# Patient Record
Sex: Male | Born: 1960
Health system: Southern US, Community
[De-identification: ages and names within clinical notes are randomized; demographics above are authoritative.]

## PROBLEM LIST (undated history)

## (undated) DIAGNOSIS — Z860101 Personal history of adenomatous and serrated colon polyps: Secondary | ICD-10-CM

## (undated) DIAGNOSIS — E1165 Type 2 diabetes mellitus with hyperglycemia: Secondary | ICD-10-CM

## (undated) DIAGNOSIS — Z794 Long term (current) use of insulin: Secondary | ICD-10-CM

## (undated) DIAGNOSIS — R8581 Anal high risk human papillomavirus (HPV) DNA test positive: Secondary | ICD-10-CM

## (undated) DIAGNOSIS — B20 Human immunodeficiency virus [HIV] disease: Secondary | ICD-10-CM

## (undated) DIAGNOSIS — E785 Hyperlipidemia, unspecified: Secondary | ICD-10-CM

## (undated) DIAGNOSIS — E119 Type 2 diabetes mellitus without complications: Secondary | ICD-10-CM

## (undated) DIAGNOSIS — Z973 Presence of spectacles and contact lenses: Secondary | ICD-10-CM

## (undated) DIAGNOSIS — Z86010 Personal history of colonic polyps: Secondary | ICD-10-CM

## (undated) HISTORY — PX: CHOLECYSTECTOMY, LAPAROSCOPIC: SHX56

---

## 2005-03-03 ENCOUNTER — Emergency Department: Payer: Self-pay | Admitting: Emergency Medicine

## 2009-10-04 ENCOUNTER — Ambulatory Visit: Payer: Self-pay | Admitting: Gastroenterology

## 2015-03-05 DIAGNOSIS — E785 Hyperlipidemia, unspecified: Secondary | ICD-10-CM | POA: Insufficient documentation

## 2015-03-05 DIAGNOSIS — E119 Type 2 diabetes mellitus without complications: Secondary | ICD-10-CM | POA: Insufficient documentation

## 2017-05-24 ENCOUNTER — Encounter: Payer: Self-pay | Admitting: *Deleted

## 2017-05-25 ENCOUNTER — Encounter: Payer: Self-pay | Admitting: Anesthesiology

## 2017-05-25 ENCOUNTER — Ambulatory Visit: Payer: BLUE CROSS/BLUE SHIELD | Admitting: Anesthesiology

## 2017-05-25 ENCOUNTER — Ambulatory Visit
Admission: RE | Admit: 2017-05-25 | Discharge: 2017-05-25 | Disposition: A | Discharge status: Home / Self Care | Payer: BLUE CROSS/BLUE SHIELD | Source: Ambulatory Visit | Attending: Gastroenterology | Admitting: Gastroenterology

## 2017-05-25 ENCOUNTER — Encounter
Admission: RE | Disposition: A | Discharge status: Home / Self Care | Payer: Self-pay | Source: Ambulatory Visit | Attending: Gastroenterology

## 2017-05-25 DIAGNOSIS — E785 Hyperlipidemia, unspecified: Secondary | ICD-10-CM | POA: Diagnosis not present

## 2017-05-25 DIAGNOSIS — Z79899 Other long term (current) drug therapy: Secondary | ICD-10-CM | POA: Diagnosis not present

## 2017-05-25 DIAGNOSIS — Z8371 Family history of colonic polyps: Secondary | ICD-10-CM | POA: Insufficient documentation

## 2017-05-25 DIAGNOSIS — D124 Benign neoplasm of descending colon: Secondary | ICD-10-CM | POA: Diagnosis not present

## 2017-05-25 DIAGNOSIS — Z794 Long term (current) use of insulin: Secondary | ICD-10-CM | POA: Insufficient documentation

## 2017-05-25 DIAGNOSIS — E119 Type 2 diabetes mellitus without complications: Secondary | ICD-10-CM | POA: Diagnosis not present

## 2017-05-25 DIAGNOSIS — K219 Gastro-esophageal reflux disease without esophagitis: Secondary | ICD-10-CM | POA: Diagnosis not present

## 2017-05-25 HISTORY — PX: COLONOSCOPY WITH PROPOFOL: SHX5780

## 2017-05-25 HISTORY — DX: Hyperlipidemia, unspecified: E78.5

## 2017-05-25 HISTORY — DX: Type 2 diabetes mellitus without complications: E11.9

## 2017-05-25 LAB — GLUCOSE, CAPILLARY: GLUCOSE-CAPILLARY: 119 mg/dL — AB (ref 65–99)

## 2017-05-25 SURGERY — COLONOSCOPY WITH PROPOFOL
Anesthesia: General

## 2017-05-25 MED ORDER — LIDOCAINE HCL (CARDIAC) 20 MG/ML IV SOLN
INTRAVENOUS | Status: DC | PRN
  Administered 2017-05-25: 40 mg via INTRAVENOUS

## 2017-05-25 MED ORDER — EPHEDRINE SULFATE 50 MG/ML IJ SOLN
INTRAMUSCULAR | Status: AC
  Filled 2017-05-25: qty 1

## 2017-05-25 MED ORDER — PHENYLEPHRINE HCL 10 MG/ML IJ SOLN
INTRAMUSCULAR | Status: AC
  Filled 2017-05-25: qty 1

## 2017-05-25 MED ORDER — SODIUM CHLORIDE 0.9 % IV SOLN: INTRAVENOUS | Status: DC

## 2017-05-25 MED ORDER — GLYCOPYRROLATE 0.2 MG/ML IJ SOLN
INTRAMUSCULAR | Status: AC
  Filled 2017-05-25: qty 1

## 2017-05-25 MED ORDER — SODIUM CHLORIDE 0.9 % IV SOLN
INTRAVENOUS | Status: DC
Start: 2017-05-25 — End: 2017-05-25

## 2017-05-25 MED ORDER — SODIUM CHLORIDE 0.9 % IV SOLN
INTRAVENOUS | Status: DC
  Administered 2017-05-25: 1000 mL via INTRAVENOUS

## 2017-05-25 MED ORDER — MIDAZOLAM HCL 2 MG/2ML IJ SOLN
INTRAMUSCULAR | Status: DC | PRN
  Administered 2017-05-25: 1 mg via INTRAVENOUS

## 2017-05-25 MED ORDER — PROPOFOL 10 MG/ML IV BOLUS
INTRAVENOUS | Status: DC | PRN
  Administered 2017-05-25: 70 mg via INTRAVENOUS

## 2017-05-25 MED ORDER — MIDAZOLAM HCL 2 MG/2ML IJ SOLN
INTRAMUSCULAR | Status: AC
  Filled 2017-05-25: qty 2

## 2017-05-25 MED ORDER — PROPOFOL 500 MG/50ML IV EMUL
INTRAVENOUS | Status: DC | PRN
  Administered 2017-05-25: 140 ug/kg/min via INTRAVENOUS

## 2017-05-25 MED ORDER — PROPOFOL 500 MG/50ML IV EMUL
INTRAVENOUS | Status: AC
  Filled 2017-05-25: qty 150

## 2017-05-25 MED ORDER — LIDOCAINE HCL (PF) 2 % IJ SOLN
INTRAMUSCULAR | Status: AC
  Filled 2017-05-25: qty 2

## 2017-05-25 NOTE — H&P (Signed)
Outpatient short stay form Pre-procedure 05/25/2017 7:51 AM Lollie Sails MD  Primary Physician: Dr. Juluis Pitch  Reason for visit:  Colonoscopy  History of present illness:  Patient is a 56 year old male presenting today as above. He tolerated his prep well. He takes no aspirin or blood thinning agents. He does have a family history: Polyps her primary relative, father.    Current Facility-Administered Medications:  .  0.9 %  sodium chloride infusion, , Intravenous, Continuous, Lollie Sails, MD, Last Rate: 20 mL/hr at 05/25/17 0718, 1,000 mL at 05/25/17 0718 .  0.9 %  sodium chloride infusion, , Intravenous, Continuous, Lollie Sails, MD .  0.9 %  sodium chloride infusion, , Intravenous, Continuous, Lollie Sails, MD  Prescriptions Prior to Admission  Medication Sig Dispense Refill Last Dose  . Cetirizine HCl 10 MG CAPS Take 10 mg by mouth daily.     . fenofibrate 160 MG tablet Take 160 mg by mouth daily.     . Insulin Glargine (LANTUS) 100 UNIT/ML Solostar Pen Inject 50-100 Units into the skin. Daily as directed     . metFORMIN (GLUCOPHAGE) 1000 MG tablet Take 1,000 mg by mouth 2 (two) times daily with a meal.     . saxagliptin HCl (ONGLYZA) 5 MG TABS tablet Take by mouth daily.     . simvastatin (ZOCOR) 40 MG tablet Take 40 mg by mouth daily.        Not on File   Past Medical History:  Diagnosis Date  . Diabetes mellitus without complication ([place])   . Hyperlipidemia     Review of systems:      Physical Exam    Heart and lungs: Regular rate and rhythm without rub or gallop, lungs are bilaterally clear    HEENT: Normocephalic atraumatic eyes are anicteric    Other:     Pertinant exam for procedure: Soft nontender nondistended bowel sounds positive normoactive.    Planned proceedures: Colonoscopy and indicated procedures. I have discussed the risks benefits and complications of procedures to include not limited to bleeding, infection,  perforation and the risk of sedation and the patient wishes to proceed.    Lollie Sails, MD Gastroenterology 05/25/2017  7:51 AM

## 2017-05-25 NOTE — Transfer of Care (Signed)
Immediate Anesthesia Transfer of Care Note  Patient: Phillip Edwards  Procedure(s) Performed: Procedure(s): COLONOSCOPY WITH PROPOFOL (N/A)  Patient Location: PACU  Anesthesia Type:General  Level of Consciousness: awake, alert  and oriented  Airway & Oxygen Therapy: Patient Spontanous Breathing and Patient connected to nasal cannula oxygen  Post-op Assessment: Report given to RN and Post -op Vital signs reviewed and stable  Post vital signs: Reviewed and stable  Last Vitals:  Vitals:   05/25/17 0820 05/25/17 0822  BP: 91/69 91/69  Pulse: 87 82  Resp: 14 13  Temp: (!) 36.1 C (!) 36.1 C    Last Pain:  Vitals:   05/25/17 0822  TempSrc: Tympanic         Complications: No apparent anesthesia complications

## 2017-05-25 NOTE — Op Note (Signed)
[facility] Gastroenterology Patient Name: Phillip Edwards Procedure Date: 05/25/2017 7:50 AM MRN: [id number] Account #: [id number] Date of Birth: 1961-08-10 Admit Type: Outpatient Age: 56 Room: [facility] ENDO ROOM 1 Gender: Male Note Status: Finalized Procedure:            Colonoscopy Providers:            Lollie Sails, MD Referring MD:         Youlanda Roys. Lovie Macadamia, MD (Referring MD) Medicines:            Monitored Anesthesia Care Complications:        No immediate complications. Procedure:            Pre-Anesthesia Assessment:                       - ASA Grade Assessment: III - A patient with severe                        systemic disease.                       After obtaining informed consent, the colonoscope was                        passed under direct vision. Throughout the procedure,                        the patient's blood pressure, pulse, and oxygen                        saturations were monitored continuously. The                        Colonoscope was introduced through the anus and                        advanced to the the cecum, identified by appendiceal                        orifice and ileocecal valve. The colonoscopy was                        performed without difficulty. The patient tolerated the                        procedure well. The quality of the bowel preparation                        was good. Findings:      A 5 mm polyp was found in the proximal descending colon. The polyp was       sessile. The polyp was removed with a cold snare. Resection and       retrieval were complete.      The digital rectal exam was normal.      The exam was otherwise without abnormality. Impression:           - One 5 mm polyp in the proximal descending colon,                        removed with a cold snare. Resected and retrieved. Recommendation:       -  Discharge patient to home. Procedure Code(s):    --- Professional ---  [phone], Colonoscopy, flexible; with removal of tumor(s),                        polyp(s), or other lesion(s) by snare technique Diagnosis Code(s):    --- Professional ---                       D12.4, Benign neoplasm of descending colon CPT copyright 2016 American Medical Association. All rights reserved. The codes documented in this report are preliminary and upon coder review may  be revised to meet current compliance requirements. Lollie Sails, MD 05/25/2017 8:20:29 AM This report has been signed electronically. Number of Addenda: 0 Note Initiated On: 05/25/2017 7:50 AM Scope Withdrawal Time: 0 hours 12 minutes 27 seconds  Total Procedure Duration: 0 hours 20 minutes 42 seconds       [facility]

## 2017-05-25 NOTE — Anesthesia Preprocedure Evaluation (Signed)
Anesthesia Evaluation  Patient identified by MRN, date of birth, ID band Patient awake    Reviewed: Allergy & Precautions, H&P , NPO status , Patient's Chart, lab work & pertinent test results  History of Anesthesia Complications Negative for: history of anesthetic complications  Airway Mallampati: III  TM Distance: <3 FB Neck ROM: full    Dental  (+) Chipped, Caps   Pulmonary neg pulmonary ROS, neg shortness of breath,           Cardiovascular Exercise Tolerance: Good (-) angina(-) Past MI and (-) DOE negative cardio ROS       Neuro/Psych negative neurological ROS  negative psych ROS   GI/Hepatic negative GI ROS, Neg liver ROS, neg GERD  ,  Endo/Other  diabetes, Type 2, Insulin Dependent  Renal/GU negative Renal ROS  negative genitourinary   Musculoskeletal   Abdominal   Peds  Hematology negative hematology ROS (+)   Anesthesia Other Findings Past Medical History: No date: Diabetes mellitus without complication (HCC) No date: Hyperlipidemia  History reviewed. No pertinent surgical history.     Reproductive/Obstetrics negative OB ROS                             Anesthesia Physical Anesthesia Plan  ASA: III  Anesthesia Plan: General   Post-op Pain Management:    Induction: Intravenous  PONV Risk Score and Plan:   Airway Management Planned: Natural Airway and Nasal Cannula  Additional Equipment:   Intra-op Plan:   Post-operative Plan:   Informed Consent: I have reviewed the patients History and Physical, chart, labs and discussed the procedure including the risks, benefits and alternatives for the proposed anesthesia with the patient or authorized representative who has indicated his/her understanding and acceptance.   Dental Advisory Given  Plan Discussed with: Anesthesiologist, CRNA and Surgeon  Anesthesia Plan Comments: (Patient consented for risks of anesthesia  including but not limited to:  - adverse reactions to medications - risk of intubation if required - damage to teeth, lips or other oral mucosa - sore throat or hoarseness - Damage to heart, brain, lungs or loss of life  Patient voiced understanding.)        Anesthesia Quick Evaluation

## 2017-05-25 NOTE — Anesthesia Post-op Follow-up Note (Cosign Needed)
Anesthesia QCDR form completed.        

## 2017-05-25 NOTE — Anesthesia Procedure Notes (Signed)
Date/Time: 05/25/2017 7:50 AM Performed by: Hedda Slade Pre-anesthesia Checklist: Patient identified, Emergency Drugs available, Suction available and Patient being monitored Patient Re-evaluated:Patient Re-evaluated prior to induction Oxygen Delivery Method: Nasal cannula

## 2017-05-25 NOTE — Anesthesia Postprocedure Evaluation (Signed)
Anesthesia Post Note  Patient: Phillip Edwards  Procedure(s) Performed: Procedure(s) (LRB): COLONOSCOPY WITH PROPOFOL (N/A)  Patient location during evaluation: Endoscopy Anesthesia Type: General Level of consciousness: awake and alert Pain management: pain level controlled Vital Signs Assessment: post-procedure vital signs reviewed and stable Respiratory status: spontaneous breathing, nonlabored ventilation, respiratory function stable and patient connected to nasal cannula oxygen Cardiovascular status: blood pressure returned to baseline and stable Postop Assessment: no signs of nausea or vomiting Anesthetic complications: no     Last Vitals:  Vitals:   05/25/17 0840 05/25/17 0850  BP: 111/80 108/82  Pulse: 78 70  Resp: 12 12  Temp:      Last Pain:  Vitals:   05/25/17 0822  TempSrc: Tympanic                 Precious Haws Piscitello

## 2017-05-28 ENCOUNTER — Encounter: Payer: Self-pay | Admitting: Gastroenterology

## 2017-05-29 LAB — SURGICAL PATHOLOGY

## 2018-09-08 DIAGNOSIS — Z23 Encounter for immunization: Secondary | ICD-10-CM | POA: Diagnosis not present

## 2018-12-06 DIAGNOSIS — E119 Type 2 diabetes mellitus without complications: Secondary | ICD-10-CM | POA: Diagnosis not present

## 2018-12-06 DIAGNOSIS — E785 Hyperlipidemia, unspecified: Secondary | ICD-10-CM | POA: Diagnosis not present

## 2018-12-06 DIAGNOSIS — Z125 Encounter for screening for malignant neoplasm of prostate: Secondary | ICD-10-CM | POA: Diagnosis not present

## 2018-12-10 DIAGNOSIS — E119 Type 2 diabetes mellitus without complications: Secondary | ICD-10-CM | POA: Diagnosis not present

## 2018-12-10 DIAGNOSIS — E785 Hyperlipidemia, unspecified: Secondary | ICD-10-CM | POA: Diagnosis not present

## 2018-12-10 DIAGNOSIS — Z794 Long term (current) use of insulin: Secondary | ICD-10-CM | POA: Diagnosis not present

## 2018-12-10 DIAGNOSIS — Z125 Encounter for screening for malignant neoplasm of prostate: Secondary | ICD-10-CM | POA: Diagnosis not present

## 2020-11-06 DIAGNOSIS — Z8669 Personal history of other diseases of the nervous system and sense organs: Secondary | ICD-10-CM

## 2020-11-06 DIAGNOSIS — H5462 Unqualified visual loss, left eye, normal vision right eye: Secondary | ICD-10-CM

## 2020-11-06 HISTORY — DX: Personal history of other diseases of the nervous system and sense organs: Z86.69

## 2020-11-06 HISTORY — DX: Unqualified visual loss, left eye, normal vision right eye: H54.62

## 2020-12-28 ENCOUNTER — Ambulatory Visit: Payer: Self-pay | Admitting: Family Medicine

## 2020-12-28 ENCOUNTER — Other Ambulatory Visit: Payer: Self-pay

## 2020-12-28 ENCOUNTER — Ambulatory Visit: Payer: Self-pay

## 2020-12-28 ENCOUNTER — Encounter: Payer: Self-pay | Admitting: Family Medicine

## 2020-12-28 ENCOUNTER — Telehealth: Payer: Self-pay

## 2020-12-28 DIAGNOSIS — Z202 Contact with and (suspected) exposure to infections with a predominantly sexual mode of transmission: Secondary | ICD-10-CM

## 2020-12-28 DIAGNOSIS — Z113 Encounter for screening for infections with a predominantly sexual mode of transmission: Secondary | ICD-10-CM

## 2020-12-28 MED ORDER — PENICILLIN G BENZATHINE 1200000 UNIT/2ML IM SUSP
2.4000 10*6.[IU] | Freq: Once | INTRAMUSCULAR | Status: AC
  Administered 2020-12-28: 2.4 10*6.[IU] via INTRAMUSCULAR

## 2020-12-28 NOTE — Progress Notes (Signed)
TR pending. Bicillin administered per provider orders. Pt declined to stay 20 min post bicillin as requested by RN. Provider orders completed.

## 2020-12-28 NOTE — Progress Notes (Signed)
[facility] Department STI clinic/screening visit  Subjective:  Phillip Edwards is a 60 y.o. male being seen today for an STI screening visit. The patient reports they do not have symptoms.    Patient has the following medical conditions:  There are no problems to display for this patient.    Chief Complaint  Patient presents with  . SEXUALLY TRANSMITTED DISEASE    STD Screening including bloodwork    HPI  Patient reports having partner that tested positive for syphilis, was tested at [facility] and    See flowsheet for further details and programmatic requirements.    The following portions of the patient's history were reviewed and updated as appropriate: allergies, current medications, past medical history, past social history, past surgical history and problem list.  Objective:  There were no vitals filed for this visit.  Physical Exam Constitutional:      Appearance: Normal appearance.  HENT:     Head: Normocephalic.     Mouth/Throat:     Mouth: Mucous membranes are moist.     Pharynx: Oropharynx is clear. No oropharyngeal exudate.  Pulmonary:     Effort: Pulmonary effort is normal.  Genitourinary:    Rectum: Normal.     Comments: No lice, nits, or pest, no lesions or odor discharge.  Denies pain or tenderness with paplation of testicles.  No lesions, ulcers or masses present.    Musculoskeletal:     Cervical back: Normal range of motion.  Lymphadenopathy:     Cervical: No cervical adenopathy.  Skin:    General: Skin is warm and dry.     Findings: No bruising, erythema, lesion or rash.  Neurological:     Mental Status: He is alert and oriented to person, place, and time.  Psychiatric:        Mood and Affect: Mood normal.        Behavior: Behavior normal.       Assessment and Plan:  Phillip Edwards is a 60 y.o. male presenting to the [facility] Department for STI screening  1. Screening examination for venereal disease Patient  in clinic for sti testing today.   - Chlamydia/Gonorrhea [place] [facility]- urine - HBV Antigen/Antibody [facility] - HIV/HCV [place] [facility] - Syphilis Serology, [place] [facility] - Chlamydia/Gonorrhea [place] [facility]- throat  - Chlamydia/Gonorrhea [place] [facility]-rectal   2. Exposure to syphilis Patient has partner that tested positive for Syphilis, patient was last tested on 11/01/2020 and was negative.  Patient concerned about a rash.  Upon examination, no syphilis rash present.  Patient did have multiple abrasions or papules on left anterior lower leg and left knee but not consistent with syphilis.  Will treat patient as contact to syphilis.  Patient will be contacted if test results are abnormal and needs additional treatment.    RX: - penicillin g benzathine (BICILLIN LA) 1200000 UNIT/2ML injection 2.4 Million Units  Patient does not have STI symptoms Patient accepted all screenings including   oral, urine, rectal CT/GC and bloodwork for HIV/RPR/HCV/HBV Patient meets criteria for HepB screening? Yes. Ordered? Yes Patient meets criteria for HepC screening? Yes. Ordered? Yes Recommended condom use with all sex, but not sex for 14 days.   Discussed importance of condom use for STI prevention.    Discussed time line for [facility] results and that patient will be called with positive results and encouraged patient to call if he had not heard in 2 weeks  Discussed with  patient about syphilis and testing and how syphilis remains present even when not infectious.    Recommended returning for continued or worsening symptoms.   Return in about 10 days (around 01/07/2021) for possible need for treatment #2 .  No future appointments.  Junious Dresser, FNP

## 2020-12-28 NOTE — Telephone Encounter (Signed)
Call to patient to RS. Patient rescheduled for today at 4:00pm. Patient is having an "outbreak". Deri Fuelling, RN

## 2021-01-04 LAB — HM HIV SCREENING LAB: HM HIV Screening: NEGATIVE

## 2021-01-04 LAB — HM HEPATITIS C SCREENING LAB: HM Hepatitis Screen: NEGATIVE

## 2021-01-04 LAB — HEPATITIS B SURFACE ANTIGEN

## 2021-01-06 ENCOUNTER — Telehealth: Payer: Self-pay

## 2021-01-06 DIAGNOSIS — A549 Gonococcal infection, unspecified: Secondary | ICD-10-CM

## 2021-01-06 NOTE — Telephone Encounter (Signed)
TC to patient. Verified ID via password/SS#. Informed of positive GC and need for tx. Instructed to eat before visit and have partner call for tx appt. Appt scheduled. Aileen Fass, RN

## 2021-01-07 ENCOUNTER — Other Ambulatory Visit: Payer: Self-pay

## 2021-01-07 ENCOUNTER — Ambulatory Visit: Payer: 59

## 2021-01-07 DIAGNOSIS — A549 Gonococcal infection, unspecified: Secondary | ICD-10-CM

## 2021-01-07 MED ORDER — CEFTRIAXONE SODIUM 500 MG IJ SOLR
500.0000 mg | Freq: Once | INTRAMUSCULAR | Status: AC
  Administered 2021-01-07: 500 mg via INTRAMUSCULAR

## 2021-01-07 NOTE — Progress Notes (Signed)
Pt here for gonorrhea tx and requests Syphilis results from 12/28/2020. Marland Kitchen Consult with Criss Rosales, PA who obtained results from [facility] and  Syphilis TP Non reactive . Results given and explained to pt. No further tx for syphilis indicated today, per [place]. [place], [place]. Treated today for gonorrhea with Ceftriaxone per [facility] Dr. Raliegh Ip. [place].Reports soreness in hips and requests injection in L Deltoid. Tolerated well. Refused to stay for 20 min observation after injection d/t time constraints. Josie Saunders, RN

## 2021-01-07 NOTE — Progress Notes (Signed)
Consulted by RN re: patient situation.  Reviewed RN note and agree that it reflects our discussion and my recommendations.

## 2021-01-17 NOTE — Addendum Note (Signed)
Addended by: Junious Dresser on: 01/17/2021 04:47 PM   Modules accepted: Level of Service

## 2021-03-11 ENCOUNTER — Other Ambulatory Visit: Payer: Self-pay | Admitting: Optometry

## 2021-03-11 DIAGNOSIS — H469 Unspecified optic neuritis: Secondary | ICD-10-CM

## 2021-03-15 ENCOUNTER — Ambulatory Visit: Admission: RE | Admit: 2021-03-15 | Payer: 59 | Source: Ambulatory Visit

## 2021-03-15 ENCOUNTER — Ambulatory Visit: Payer: 59

## 2021-03-16 ENCOUNTER — Ambulatory Visit
Admission: RE | Admit: 2021-03-16 | Discharge: 2021-03-16 | Disposition: A | Discharge status: Home / Self Care | Payer: 59 | Source: Ambulatory Visit | Attending: Optometry | Admitting: Optometry

## 2021-03-16 ENCOUNTER — Other Ambulatory Visit: Payer: Self-pay

## 2021-03-16 DIAGNOSIS — H469 Unspecified optic neuritis: Secondary | ICD-10-CM | POA: Diagnosis not present

## 2021-03-16 MED ORDER — GADOBUTROL 1 MMOL/ML IV SOLN
7.0000 mL | Freq: Once | INTRAVENOUS | Status: AC | PRN
  Administered 2021-03-16: 7 mL via INTRAVENOUS

## 2021-06-07 IMAGING — MR MR HEAD WO/W CM
14 series · 48 of 48 positions shown · IV contrast (7ml Gadavist)
Comparison: No pertinent prior exam.

CLINICAL DATA: Optic neuropathy.

EXAM:
MRI OF THE BRAIN WITHOUT AND WITH CONTRAST
MRI OF THE ORBITS WITHOUT AND WITH CONTRAST
TECHNIQUE: Multiplanar, multi-echo pulse sequences of the brain, orbits and
surrounding structures were acquired including fat saturation
techniques, before and after intravenous contrast administration.
CONTRAST:  7mL GADAVIST GADOBUTROL 1 MMOL/ML IV SOLN

[Series 5: ax dwi_tracew · axial · 3.0mm · 0.65mm/px · z∈[-72,+80]mm · 3 of 48 slices shown]
[im 1/48]
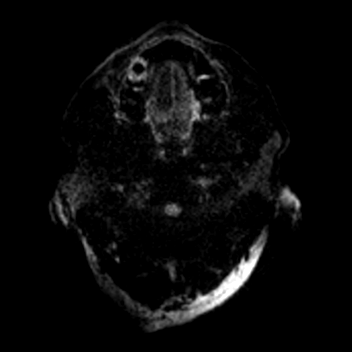
[im 24/48]
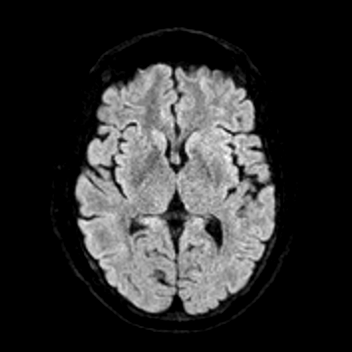
[im 48/48]
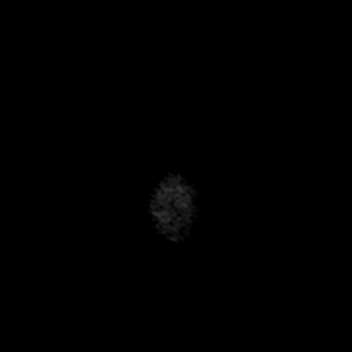

[Series 6: ax dwi_adc · axial · 3.0mm · 0.65mm/px · z∈[-72,+80]mm · 3 of 48 slices shown]
[im 1/48]
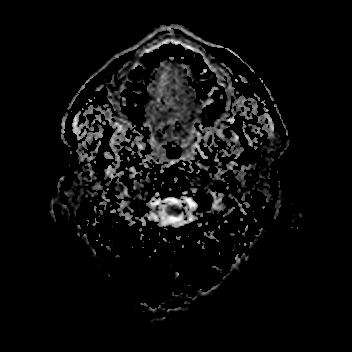
[im 24/48]
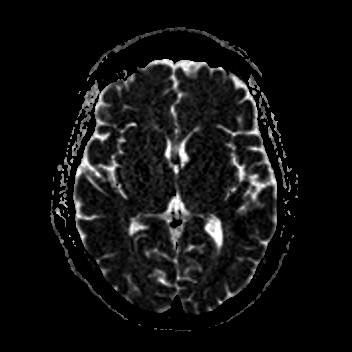
[im 48/48]
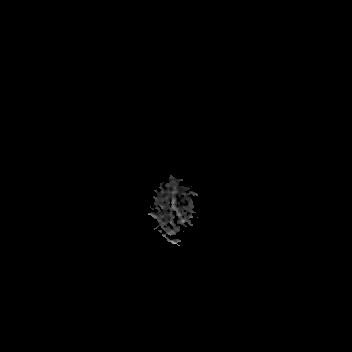

[Series 7: cor dwi_tracew · coronal · 5.0mm · 0.60mm/px · 2 of 38 slices shown]
[im 1/38]
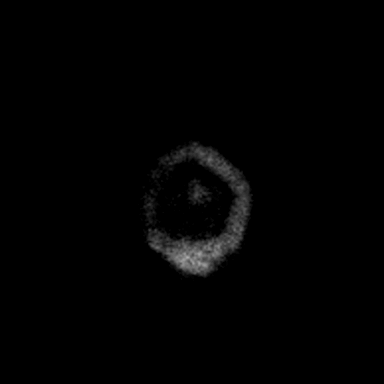
[im 38/38]
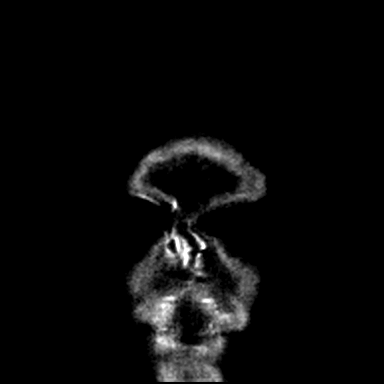

[Series 8: cor dwi_adc · coronal · 5.0mm · 0.60mm/px · 2 of 38 slices shown]
[im 1/38]
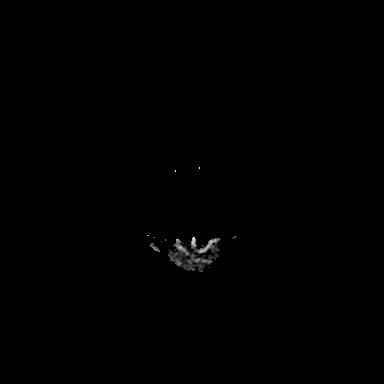
[im 38/38]
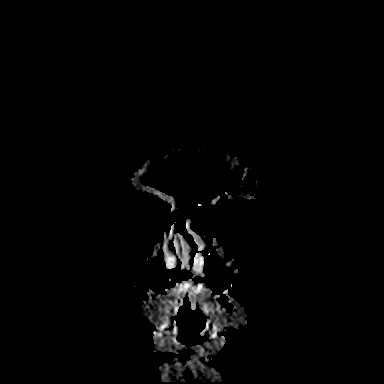

[Series 9: T1 · sagittal · 5.0mm · 0.62mm/px · 1 of 25 slices shown (1 of 2)]
[im 1/25]
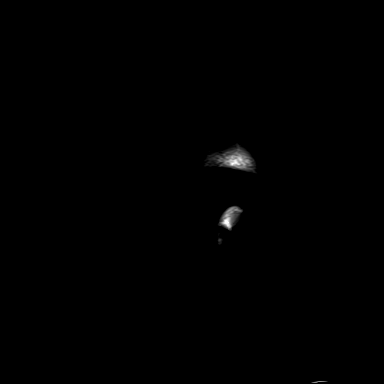

[Series 10: T2 · axial · 5.0mm · 0.53mm/px · 1 of 25 slices shown]
[im 1/25]
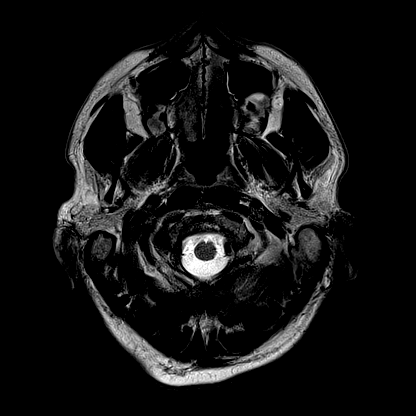

[Series 11: mag_images · axial · 3.0mm · 0.90mm/px · z∈[-77,+97]mm · 3 of 60 slices shown]
[im 1/60]
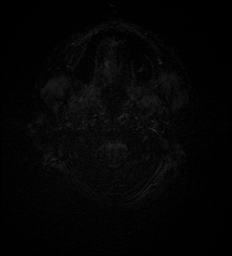
[im 30/60]
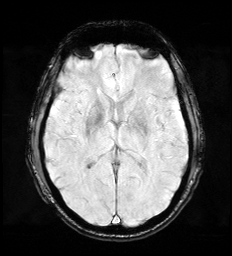
[im 60/60]
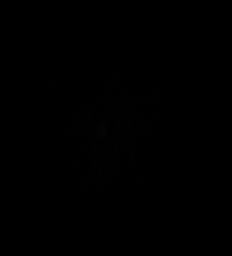

[Series 12: pha_images · axial · 3.0mm · 0.90mm/px · z∈[-77,+94]mm · 3 of 58 slices shown]
[im 1/58]
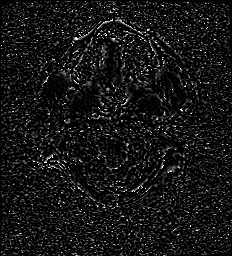
[im 29/58]
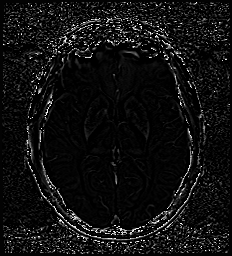
[im 58/58]
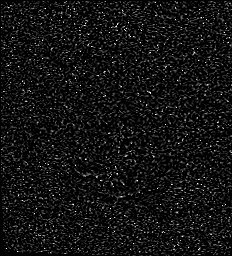

[Series 13: swi_images · axial · 3.0mm · 0.90mm/px · z∈[-77,+97]mm · 3 of 60 slices shown]
[im 1/60]
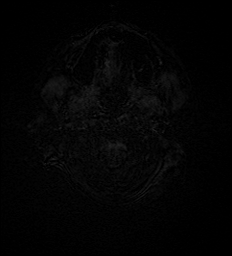
[im 30/60]
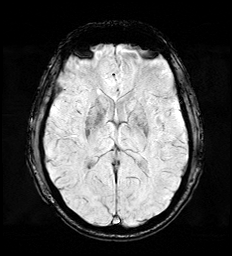
[im 60/60]
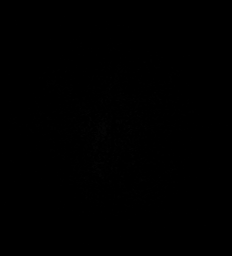

[Series 15: FLAIR · axial · 3.0mm · 0.53mm/px · z∈[-71,+88]mm · 3 of 55 slices shown]
[im 1/55]
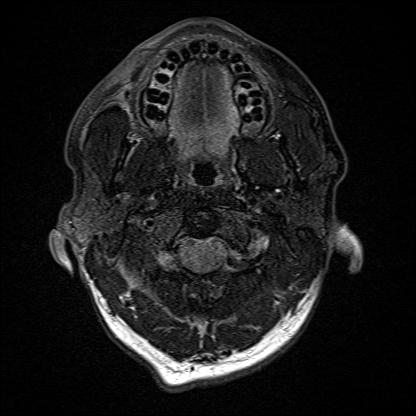
[im 28/55]
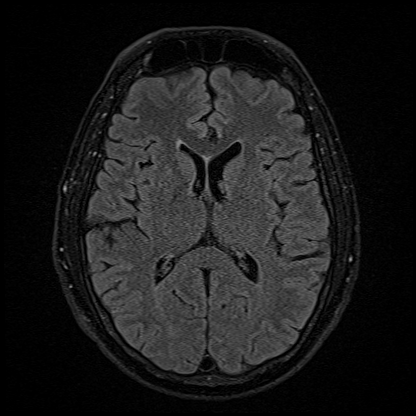
[im 55/55]
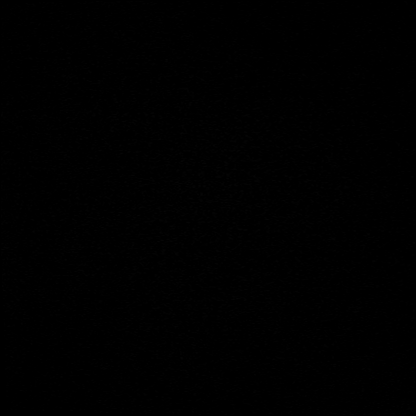

[Series 16: T1 · axial · 1.0mm · 0.98mm/px · z∈[-83,+89]mm · 10 of 175 slices shown (2 of 2)]
[im 1/175]
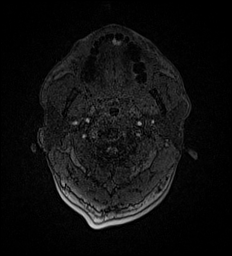
[im 20/175]
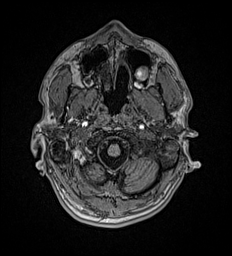
[im 39/175]
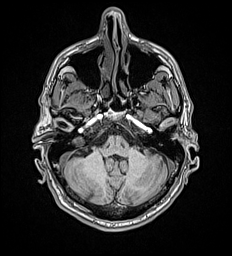
[im 59/175]
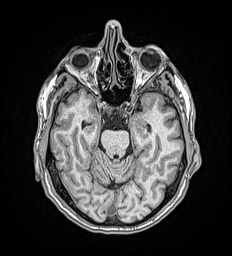
[im 78/175]
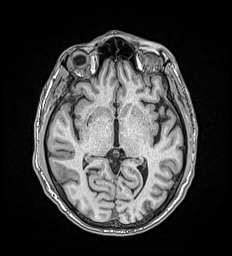
[im 97/175]
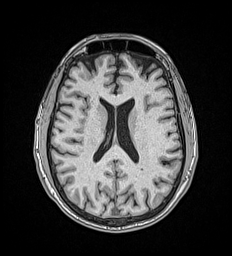
[im 117/175]
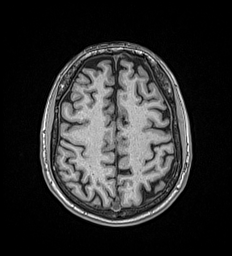
[im 136/175]
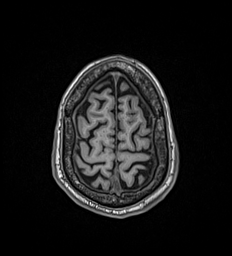
[im 155/175]
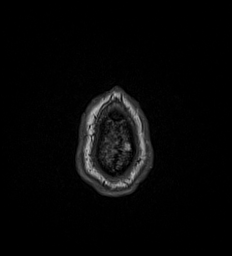
[im 175/175]
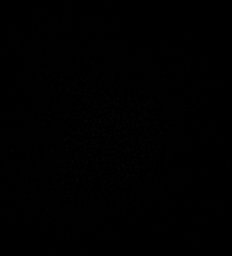

[Series 17: T2 post-contrast · coronal · 5.0mm · 0.57mm/px · 2 of 29 slices shown]
[im 1/29]
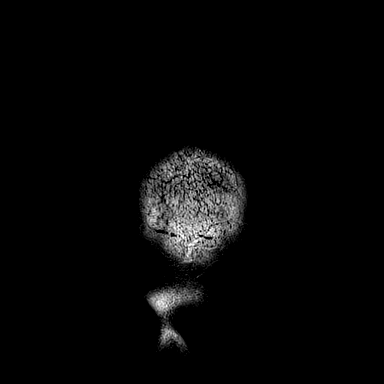
[im 29/29]
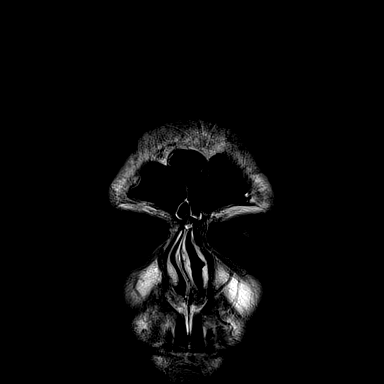

[Series 18: T1 post-contrast · axial · 1.0mm · 0.98mm/px · z∈[-83,+90]mm · 10 of 175 slices shown (1 of 2)]
[im 1/175]
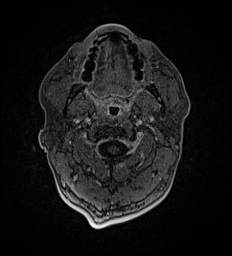
[im 20/175]
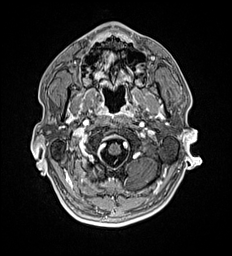
[im 39/175]
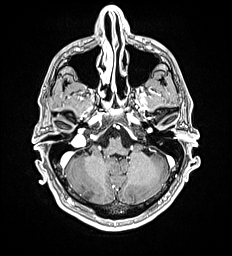
[im 59/175]
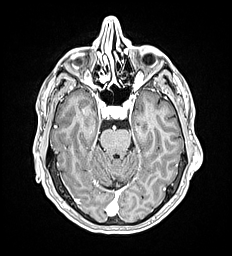
[im 78/175]
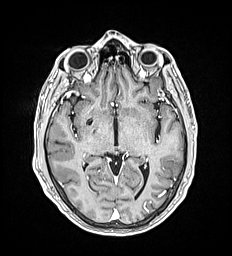
[im 97/175]
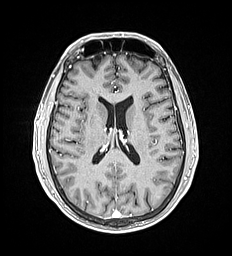
[im 117/175]
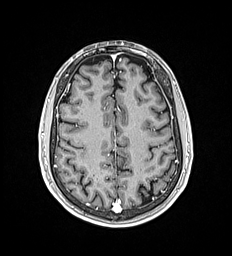
[im 136/175]
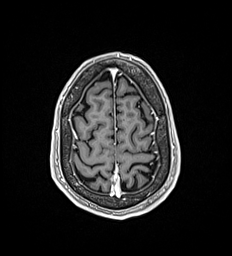
[im 155/175]
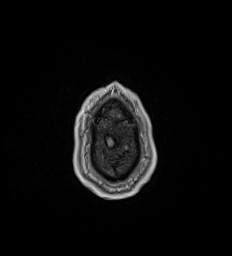
[im 175/175]
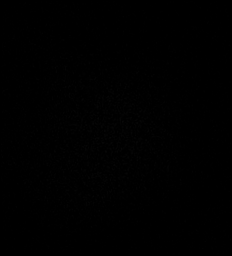

[Series 19: T1 post-contrast · coronal · 5.0mm · 0.57mm/px · 2 of 29 slices shown (2 of 2)]
[im 1/29]
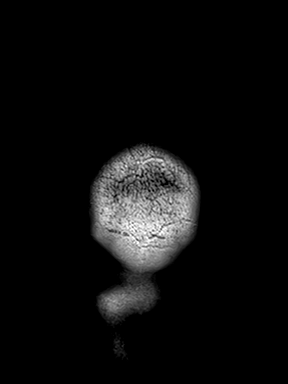
[im 29/29]
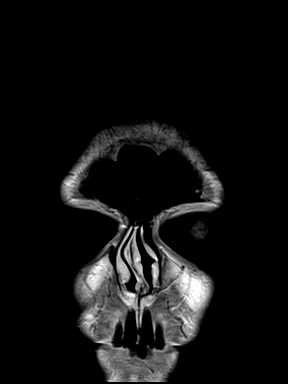

[48 of 48 positions shown; findings below may reference images not displayed]

FINDINGS: BRAIN:

Parenchyma: No acute infarction, hemorrhage, hydrocephalus,
extra-axial collection or mass lesion. The brain parenchyma has
normal morphology and signal characteristics. No focus of abnormal
contrast enhancement.

Vascular: Normal flow voids.

Skull and upper cervical spine: Normal marrow signal.

Sinuses: Mucosal retention cyst in the left maxillary sinus.

ORBITS:

Globes: Normal.

Lacrimal glands: Normal.

Retrobulbar fat: No mass or inflammatory change.

Extraocular muscles: Normal bulk and signal intensity.

Optic pathway: Optic nerves, chiasm, and tracts are normal. No
abnormalities involving the optic radiations or occipital lobes.

Cavernous sinuses: Normal.

Cranial nerves and brainstem: Normal. No abnormal enhancement.
IMPRESSION: Unremarkable MRI of the brain.  No evidence of optic neuritis.

## 2021-11-03 ENCOUNTER — Ambulatory Visit: Payer: BC Managed Care – PPO

## 2022-04-06 DIAGNOSIS — B20 Human immunodeficiency virus [HIV] disease: Secondary | ICD-10-CM

## 2022-04-06 DIAGNOSIS — Z21 Asymptomatic human immunodeficiency virus [HIV] infection status: Secondary | ICD-10-CM

## 2022-04-06 HISTORY — DX: Human immunodeficiency virus (HIV) disease: B20

## 2022-04-06 HISTORY — DX: Asymptomatic human immunodeficiency virus (hiv) infection status: Z21

## 2022-05-04 ENCOUNTER — Encounter: Payer: Self-pay | Admitting: Emergency Medicine

## 2022-05-04 ENCOUNTER — Ambulatory Visit: Admission: EM | Admit: 2022-05-04 | Discharge: 2022-05-04 | Payer: BC Managed Care – PPO

## 2022-05-04 ENCOUNTER — Emergency Department: Payer: BC Managed Care – PPO

## 2022-05-04 ENCOUNTER — Inpatient Hospital Stay
Admission: EM | Admit: 2022-05-04 | Discharge: 2022-05-08 | DRG: 371 | Disposition: A | Discharge status: Home / Self Care | Payer: BC Managed Care – PPO | Attending: Internal Medicine | Admitting: Internal Medicine

## 2022-05-04 ENCOUNTER — Other Ambulatory Visit: Payer: Self-pay

## 2022-05-04 DIAGNOSIS — E860 Dehydration: Secondary | ICD-10-CM | POA: Diagnosis present

## 2022-05-04 DIAGNOSIS — E872 Acidosis, unspecified: Secondary | ICD-10-CM | POA: Diagnosis present

## 2022-05-04 DIAGNOSIS — D72828 Other elevated white blood cell count: Secondary | ICD-10-CM

## 2022-05-04 DIAGNOSIS — A030 Shigellosis due to Shigella dysenteriae: Secondary | ICD-10-CM | POA: Diagnosis present

## 2022-05-04 DIAGNOSIS — E119 Type 2 diabetes mellitus without complications: Secondary | ICD-10-CM

## 2022-05-04 DIAGNOSIS — E861 Hypovolemia: Secondary | ICD-10-CM | POA: Diagnosis present

## 2022-05-04 DIAGNOSIS — D72829 Elevated white blood cell count, unspecified: Secondary | ICD-10-CM | POA: Diagnosis present

## 2022-05-04 DIAGNOSIS — N179 Acute kidney failure, unspecified: Secondary | ICD-10-CM | POA: Diagnosis present

## 2022-05-04 DIAGNOSIS — E8729 Other acidosis: Secondary | ICD-10-CM | POA: Diagnosis present

## 2022-05-04 DIAGNOSIS — A039 Shigellosis, unspecified: Secondary | ICD-10-CM

## 2022-05-04 DIAGNOSIS — Z7984 Long term (current) use of oral hypoglycemic drugs: Secondary | ICD-10-CM

## 2022-05-04 DIAGNOSIS — R300 Dysuria: Secondary | ICD-10-CM

## 2022-05-04 DIAGNOSIS — E871 Hypo-osmolality and hyponatremia: Secondary | ICD-10-CM | POA: Diagnosis present

## 2022-05-04 DIAGNOSIS — Z6820 Body mass index (BMI) 20.0-20.9, adult: Secondary | ICD-10-CM

## 2022-05-04 DIAGNOSIS — R197 Diarrhea, unspecified: Secondary | ICD-10-CM | POA: Diagnosis not present

## 2022-05-04 DIAGNOSIS — E782 Mixed hyperlipidemia: Secondary | ICD-10-CM

## 2022-05-04 DIAGNOSIS — E876 Hypokalemia: Secondary | ICD-10-CM | POA: Diagnosis present

## 2022-05-04 DIAGNOSIS — R634 Abnormal weight loss: Secondary | ICD-10-CM | POA: Diagnosis not present

## 2022-05-04 DIAGNOSIS — Z21 Asymptomatic human immunodeficiency virus [HIV] infection status: Secondary | ICD-10-CM | POA: Diagnosis present

## 2022-05-04 DIAGNOSIS — R739 Hyperglycemia, unspecified: Secondary | ICD-10-CM

## 2022-05-04 DIAGNOSIS — B9620 Unspecified Escherichia coli [E. coli] as the cause of diseases classified elsewhere: Secondary | ICD-10-CM | POA: Diagnosis present

## 2022-05-04 DIAGNOSIS — Z79899 Other long term (current) drug therapy: Secondary | ICD-10-CM

## 2022-05-04 DIAGNOSIS — Z7253 High risk bisexual behavior: Secondary | ICD-10-CM

## 2022-05-04 DIAGNOSIS — A09 Infectious gastroenteritis and colitis, unspecified: Secondary | ICD-10-CM

## 2022-05-04 DIAGNOSIS — A042 Enteroinvasive Escherichia coli infection: Secondary | ICD-10-CM | POA: Diagnosis not present

## 2022-05-04 DIAGNOSIS — R7881 Bacteremia: Secondary | ICD-10-CM | POA: Diagnosis present

## 2022-05-04 DIAGNOSIS — R000 Tachycardia, unspecified: Secondary | ICD-10-CM | POA: Diagnosis not present

## 2022-05-04 DIAGNOSIS — E1165 Type 2 diabetes mellitus with hyperglycemia: Secondary | ICD-10-CM | POA: Diagnosis present

## 2022-05-04 DIAGNOSIS — Z794 Long term (current) use of insulin: Secondary | ICD-10-CM

## 2022-05-04 DIAGNOSIS — E785 Hyperlipidemia, unspecified: Secondary | ICD-10-CM | POA: Diagnosis present

## 2022-05-04 DIAGNOSIS — K5100 Ulcerative (chronic) pancolitis without complications: Secondary | ICD-10-CM | POA: Diagnosis present

## 2022-05-04 DIAGNOSIS — Z7251 High risk heterosexual behavior: Secondary | ICD-10-CM

## 2022-05-04 DIAGNOSIS — E43 Unspecified severe protein-calorie malnutrition: Secondary | ICD-10-CM | POA: Diagnosis present

## 2022-05-04 HISTORY — DX: Elevated white blood cell count, unspecified: D72.829

## 2022-05-04 HISTORY — DX: Dysuria: R30.0

## 2022-05-04 LAB — CBC WITH DIFFERENTIAL/PLATELET
Abs Immature Granulocytes: 0.96 10*3/uL — ABNORMAL HIGH (ref 0.00–0.07)
Basophils Absolute: 0 10*3/uL (ref 0.0–0.1)
Basophils Relative: 0 %
Eosinophils Absolute: 0 10*3/uL (ref 0.0–0.5)
Eosinophils Relative: 0 %
HCT: 43.4 % (ref 39.0–52.0)
Hemoglobin: 15.2 g/dL (ref 13.0–17.0)
Immature Granulocytes: 7 %
Lymphocytes Relative: 11 %
Lymphs Abs: 1.6 10*3/uL (ref 0.7–4.0)
MCH: 29.6 pg (ref 26.0–34.0)
MCHC: 35 g/dL (ref 30.0–36.0)
MCV: 84.6 fL (ref 80.0–100.0)
Monocytes Absolute: 2.3 10*3/uL — ABNORMAL HIGH (ref 0.1–1.0)
Monocytes Relative: 17 %
Neutro Abs: 9.2 10*3/uL — ABNORMAL HIGH (ref 1.7–7.7)
Neutrophils Relative %: 65 %
Platelets: 268 10*3/uL (ref 150–400)
RBC: 5.13 MIL/uL (ref 4.22–5.81)
RDW: 13.1 % (ref 11.5–15.5)
Smear Review: NORMAL
WBC: 14.1 10*3/uL — ABNORMAL HIGH (ref 4.0–10.5)
nRBC: 0 % (ref 0.0–0.2)

## 2022-05-04 LAB — COMPREHENSIVE METABOLIC PANEL
ALT: 14 U/L (ref 0–44)
AST: 11 U/L — ABNORMAL LOW (ref 15–41)
Albumin: 3.2 g/dL — ABNORMAL LOW (ref 3.5–5.0)
Alkaline Phosphatase: 55 U/L (ref 38–126)
Anion gap: 19 — ABNORMAL HIGH (ref 5–15)
BUN: 28 mg/dL — ABNORMAL HIGH (ref 6–20)
CO2: 13 mmol/L — ABNORMAL LOW (ref 22–32)
Calcium: 8.2 mg/dL — ABNORMAL LOW (ref 8.9–10.3)
Chloride: 96 mmol/L — ABNORMAL LOW (ref 98–111)
Creatinine, Ser: 1.32 mg/dL — ABNORMAL HIGH (ref 0.61–1.24)
GFR, Estimated: >60 mL/min (ref >60)
Glucose, Bld: 354 mg/dL — ABNORMAL HIGH (ref 70–99)
Potassium: 3 mmol/L — ABNORMAL LOW (ref 3.5–5.1)
Sodium: 128 mmol/L — ABNORMAL LOW (ref 135–145)
Total Bilirubin: 1.9 mg/dL — ABNORMAL HIGH (ref 0.3–1.2)
Total Protein: 7.6 g/dL (ref 6.5–8.1)

## 2022-05-04 LAB — GLUCOSE, CAPILLARY
Glucose-Capillary: 154 mg/dL — ABNORMAL HIGH (ref 70–99)
Glucose-Capillary: 73 mg/dL (ref 70–99)

## 2022-05-04 LAB — TYPE AND SCREEN
ABO/RH(D): A POS
Antibody Screen: NEGATIVE

## 2022-05-04 LAB — C DIFFICILE QUICK SCREEN W PCR REFLEX
C Diff antigen: NEGATIVE
C Diff interpretation: NOT DETECTED
C Diff toxin: NEGATIVE

## 2022-05-04 LAB — LACTIC ACID, PLASMA
Lactic Acid, Venous: 1.6 mmol/L (ref 0.5–1.9)
Lactic Acid, Venous: 1.4 mmol/L (ref 0.5–1.9)

## 2022-05-04 LAB — POCT FASTING CBG KUC MANUAL ENTRY: POCT Glucose (KUC): 338 mg/dL — AB (ref 70–99)

## 2022-05-04 MED ORDER — METRONIDAZOLE 500 MG/100ML IV SOLN: 500.0000 mg | Freq: Once | INTRAVENOUS | Status: DC

## 2022-05-04 MED ORDER — SIMVASTATIN 20 MG PO TABS
40.0000 mg | ORAL_TABLET | Freq: Every day | ORAL | Status: DC
  Administered 2022-05-04 – 2022-05-08 (×5): 40 mg via ORAL
  Filled 2022-05-04: qty 2
  Filled 2022-05-04: qty 4
  Filled 2022-05-04 (×3): qty 2

## 2022-05-04 MED ORDER — ENOXAPARIN SODIUM 40 MG/0.4ML IJ SOSY
40.0000 mg | PREFILLED_SYRINGE | Freq: Every day | INTRAMUSCULAR | Status: DC
Start: 2022-05-05 — End: 2022-05-08
  Administered 2022-05-05 – 2022-05-07 (×3): 40 mg via SUBCUTANEOUS
  Filled 2022-05-04 (×3): qty 0.4

## 2022-05-04 MED ORDER — CIPROFLOXACIN IN D5W 400 MG/200ML IV SOLN
400.0000 mg | Freq: Two times a day (BID) | INTRAVENOUS | Status: DC
  Administered 2022-05-05: 400 mg via INTRAVENOUS
  Filled 2022-05-04 (×2): qty 200

## 2022-05-04 MED ORDER — CIPROFLOXACIN IN D5W 400 MG/200ML IV SOLN
400.0000 mg | Freq: Once | INTRAVENOUS | Status: AC
  Administered 2022-05-04: 400 mg via INTRAVENOUS
  Filled 2022-05-04: qty 200

## 2022-05-04 MED ORDER — LACTATED RINGERS IV BOLUS
1000.0000 mL | Freq: Once | INTRAVENOUS | Status: AC
  Administered 2022-05-04: 1000 mL via INTRAVENOUS

## 2022-05-04 MED ORDER — INSULIN GLARGINE-YFGN 100 UNIT/ML ~~LOC~~ SOLN
100.0000 [IU] | Freq: Every day | SUBCUTANEOUS | Status: DC
  Filled 2022-05-04: qty 1

## 2022-05-04 MED ORDER — INSULIN ASPART 100 UNIT/ML IJ SOLN
0.0000 [IU] | Freq: Every day | INTRAMUSCULAR | Status: DC
  Administered 2022-05-05: 4 [IU] via SUBCUTANEOUS
  Administered 2022-05-06 – 2022-05-07 (×2): 2 [IU] via SUBCUTANEOUS
  Filled 2022-05-04 (×4): qty 1

## 2022-05-04 MED ORDER — ONDANSETRON HCL 4 MG/2ML IJ SOLN
4.0000 mg | Freq: Four times a day (QID) | INTRAMUSCULAR | Status: DC | PRN
  Administered 2022-05-04: 4 mg via INTRAVENOUS
  Filled 2022-05-04: qty 2

## 2022-05-04 MED ORDER — ACETAMINOPHEN 325 MG PO TABS
650.0000 mg | ORAL_TABLET | Freq: Four times a day (QID) | ORAL | Status: DC | PRN
  Administered 2022-05-04 – 2022-05-07 (×3): 650 mg via ORAL
  Filled 2022-05-04 (×3): qty 2

## 2022-05-04 MED ORDER — CIPROFLOXACIN IN D5W 400 MG/200ML IV SOLN
400.0000 mg | Freq: Two times a day (BID) | INTRAVENOUS | Status: DC
  Filled 2022-05-04: qty 200

## 2022-05-04 MED ORDER — ONDANSETRON HCL 4 MG PO TABS: 4.0000 mg | ORAL_TABLET | Freq: Four times a day (QID) | ORAL | Status: DC | PRN

## 2022-05-04 MED ORDER — INSULIN ASPART 100 UNIT/ML IJ SOLN
0.0000 [IU] | Freq: Three times a day (TID) | INTRAMUSCULAR | Status: DC
  Administered 2022-05-04 – 2022-05-05 (×2): 3 [IU] via SUBCUTANEOUS
  Administered 2022-05-05: 2 [IU] via SUBCUTANEOUS
  Administered 2022-05-05: 8 [IU] via SUBCUTANEOUS
  Administered 2022-05-06 (×2): 5 [IU] via SUBCUTANEOUS
  Administered 2022-05-06 – 2022-05-07 (×2): 3 [IU] via SUBCUTANEOUS
  Administered 2022-05-07: 5 [IU] via SUBCUTANEOUS
  Administered 2022-05-08: 2 [IU] via SUBCUTANEOUS
  Filled 2022-05-04 (×10): qty 1

## 2022-05-04 MED ORDER — ACETAMINOPHEN 650 MG RE SUPP: 650.0000 mg | Freq: Four times a day (QID) | RECTAL | Status: DC | PRN

## 2022-05-04 MED ORDER — IOHEXOL 300 MG/ML  SOLN
100.0000 mL | Freq: Once | INTRAMUSCULAR | Status: AC | PRN
  Administered 2022-05-04: 100 mL via INTRAVENOUS

## 2022-05-04 MED ORDER — SENNOSIDES-DOCUSATE SODIUM 8.6-50 MG PO TABS: 1.0000 | ORAL_TABLET | Freq: Every evening | ORAL | Status: DC | PRN

## 2022-05-04 MED ORDER — SODIUM CHLORIDE 0.9 % IV BOLUS (SEPSIS)
1000.0000 mL | Freq: Once | INTRAVENOUS | Status: AC
  Administered 2022-05-04: 1000 mL via INTRAVENOUS

## 2022-05-04 NOTE — Assessment & Plan Note (Addendum)
Urinalysis with small hemoglobin, negative leukocytes negative nitrite, no bacteria and 0-5 WBCs, amorphous crystals and mucus present.  Suspect small kidney stone/s.  6/30: no reported dysuria

## 2022-05-04 NOTE — Assessment & Plan Note (Addendum)
Due to Shigella/EIEC infection --Monitor CBC

## 2022-05-04 NOTE — Assessment & Plan Note (Addendum)
GI panel positive for Shigella/EIEC. Treated with IV Cipro in the ED and continued on admission. Antibiotic changed to 2 g IV Rocephin daily. -- Infectious disease following, see their recommendations - Antimotility agents can be ordered once patient has received 24 to 48 hours of IV antibiotic -- Off IV fluids, tolerating diet -- Monitor renal function and electrolytes -- Enteric precautions

## 2022-05-04 NOTE — ED Triage Notes (Addendum)
Pt presents with severe diarrhea several times a day x 5 days. Pt was seen at his PCP yesterday and states he had a rough night. He is able to drink fluids, but is having a difficult time eating.

## 2022-05-04 NOTE — H&P (Addendum)
History and Physical   LINDSAY SOULLIERE QBH:[id number] DOB: 1960/12/19 DOA: 05/04/2022  PCP: Juluis Pitch, MD  Patient coming from: home  I have personally briefly reviewed patient's old medical records in [place].  Chief Concern: diarrhea  HPI: [name] is a 61 year old male with history of insulin-dependent diabetes mellitus, hyperlipidemia, ED, who presents emergency department for chief concerns of diarrhea several times per day for 5 days.  Initial vitals in the emergency department showed temperature of 98.5, respiration rate of 18, heart rate 120, blood pressure 112/73, SPO2 of 96% on room air.  Serum sodium is 128, potassium 3.0, chloride 96, bicarb 13, BUN of 26, serum creatinine of 1.32, GFR greater than 60, nonfasting blood glucose 354, anion gap was elevated at 19, WBC 14.1, hemoglobin 15.2, platelets of 268.  CT abdomen pelvis with contrast was read as marked pancolitis with potential ileitis favor inflammatory bowel disease. Variable enhancement noted with respect to rectum but still with mucosal enhancement.  Would correlate with lactate to exclude early ischemia.  Potential liver disease, with fissural widening and mild hepatic steatosis with borderline splenomegaly.  ED treatment: Ciprofloxacin 400 mg IV, LR 1 L bolus, sodium chloride 1 L bolus, metronidazole 500 mg IV. ------- At bedside he is able to tell me his name, his age, current calendar year.  He reports that on Saturday morning he developed nausea, vomiting.  He then developed multiple episodes of diarrhea, night sweats and subjective fever.  He did not check his temperature.  He also endorses dysuria and denies purulence discharge from his penis.  He denies blood in his stool at this time.    Of note on Saturday morning, he opened a new packet of blueberry pancakes (his favorite) and he made blueberry pancakes for himself.  He reports that he did not add eggs to the pancakes as they were  completely of dry ingredients.  I counseled him that since this is a new packet of blueberry pancakes, I would recommend him throwing it away once he goes home.  He denies any headache, dysphagia, chest pain, shortness of breath, trauma to his person, syncope.  He reports he is never had diarrhea like this before.  He reports that he was recently checked for HIV, specifically in May.  Sexual history: He endorses that he is currently sexually active with 2 partners.  His girlfriend recently moved away to [place] and has not had recent sexual activity with her.  He is currently sexually active with a male partner and does not use barrier protection.  Social history: He lives at home by himself.  He denies history of tobacco use.  He infrequently drinks EtOH, maybe 2 beers per month.  He denies recreational drug use.  He currently works for a [employer].  ROS: Constitutional: no weight change, + fever, + night sweats ENT/Mouth: no sore throat, no rhinorrhea Eyes: no eye pain, no vision changes Cardiovascular: no chest pain, no dyspnea,  no edema, no palpitations Respiratory: no cough, no sputum, no wheezing Gastrointestinal: + nausea, + vomiting, + diarrhea, no constipation Genitourinary: no urinary incontinence, no dysuria, no hematuria Musculoskeletal: no arthralgias, no myalgias Skin: no skin lesions, no pruritus, Neuro: + weakness, no loss of consciousness, no syncope Psych: no anxiety, no depression, + decrease appetite Heme/Lymph: no bruising, no bleeding  ED Course: Discussed with emergency medicine provider, patient requiring hospitalization for chief concerns of infectious diarrhea with pancolitis.  Assessment/Plan  Principal Problem:   Diarrhea Active  Problems:   Hyperlipidemia   Insulin dependent type 2 diabetes mellitus (HCC)   Leukocytosis   High risk sexual behavior   Dysuria   Pancolitis (HCC)   Shigella dysenteriae   Assessment and Plan:  * Diarrhea -  Shigella/EIEC positive diarrhea - Status post ciprofloxacin 400 mg IV per EDP - We will continue with ciprofloxacin 400 mg IV twice daily - Antimotility agents can be ordered once patient has received 24 to 48 hours of IV antibiotic (6/30 evening to 7/1 AM) - Checking HIV, urine chlamydia/gonorrhea - Status post LR 1 L bolus and sodium chloride 1 L bolus per EDP, I ordered additional LR 1 L bolus - Encourage p.o. intake - Admit to telemetry medical, observation  Pancolitis ([place]) - Secondary to [place] dysentery, treat as above  Dysuria - UA ordered, message nursing staff  High risk sexual behavior - Checking HIV - Checking urine chlamydia and gonorrhea - Message nursing staff via secure chat - Extensive counseling at bedside regarding safe sex practices including use of barrier protection  Leukocytosis - Presumed secondary to Shigella/EIEC infection - CBC in the a.m.  Insulin dependent type 2 diabetes mellitus (HCC) - Insulin SSI with at bedtime coverage  Hyperlipidemia - Simvastatin 40 mg daily resumed  Chart reviewed.   DVT prophylaxis: Enoxaparin Code Status: Full code Diet: Heart healthy/carb modified Family Communication: No Disposition Plan: Pending clinical course Consults called: None at this time Admission status: Telemetry medical, observe  Past Medical History:  Diagnosis Date   Diabetes mellitus without complication ([place])    Hyperlipidemia    Past Surgical History:  Procedure Laterality Date   COLONOSCOPY WITH PROPOFOL N/A 05/25/2017   Procedure: COLONOSCOPY WITH PROPOFOL;  Surgeon: Lollie Sails, MD;  Location[facility] END[facility]COPY;  Service: Endoscopy;  Laterality: N/A;   Social History:  reports that he has never smoked. He has never used smokeless tobacco. He reports current alcohol use of about 4.0 standard drinks of alcohol per week. He reports that he does not use drugs.  No Known Allergies History reviewed. No pertinent family  history. Family history: Family history reviewed and not pertinent  Prior to Admission medications   Medication Sig Start Date End Date Taking? Authorizing Provider  Cetirizine HCl 10 MG CAPS Take 10 mg by mouth daily.    [provider]  fenofibrate 160 MG tablet Take 160 mg by mouth daily.    [provider]  Insulin Glargine (LANTUS) 100 UNIT/ML Solostar Pen Inject 50-100 Units into the skin. Daily as directed    [provider]  metFORMIN (GLUCOPHAGE) 1000 MG tablet Take 1,000 mg by mouth 2 (two) times daily with a meal.    [provider]  saxagliptin HCl (ONGLYZA) 5 MG TABS tablet Take by mouth daily.    [provider]  sildenafil (VIAGRA) 100 MG tablet Take by mouth. 05/03/22   [provider]  simvastatin (ZOCOR) 40 MG tablet Take 40 mg by mouth daily.    [provider]   Physical Exam: Vitals:   05/04/22 1636 05/04/22 1859 05/04/22 1946 05/04/22 2116  BP: (!) 117/57 123/66 123/65 94/63  Pulse: (!) 117 (!) 113 (!) 114 (!) 109  Resp: '18 16 20 20  '$ Temp: 99 F (37.2 C) 99.5 F (37.5 C) (!) 101.9 F (38.8 C) 98.8 F (37.1 C)  TempSrc: Oral Oral Oral   SpO2: 98% 99% 99% 97%  Weight: 62.1 kg     Height: '5\' 9"'$  (1.753 m)  Constitutional: appears age-appropriate, NAD, calm, comfortable Eyes: PERRL, lids and conjunctivae normal ENMT: Mucous membranes are moist. Posterior pharynx clear of any exudate or lesions. Age-appropriate dentition. Hearing appropriate Neck: normal, supple, no masses, no thyromegaly Respiratory: clear to auscultation bilaterally, no wheezing, no crackles. Normal respiratory effort. No accessory muscle use.  Cardiovascular: Regular rate and rhythm, no murmurs / rubs / gallops. No extremity edema. 2+ pedal pulses. No carotid bruits.  Abdomen: no tenderness, no masses palpated, no hepatosplenomegaly.  Diffuse increase bowel sounds. Musculoskeletal: no clubbing / cyanosis. No joint deformity upper  and lower extremities. Good ROM, no contractures, no atrophy. Normal muscle tone.  Skin: no rashes, lesions, ulcers. No induration Neurologic: Sensation intact. Strength 5/5 in all 4.  Psychiatric: Normal judgment and insight. Alert and oriented x 3. Normal mood.   EKG: independently reviewed, showing sinus tachycardia with rate of 114, QTc 532  Chest x-ray on Admission: I personally reviewed and I agree with radiologist reading as below.  CT ABDOMEN PELVIS W CONTRAST  Result Date: 05/04/2022 CLINICAL DATA:  A 61 year old male presents for evaluation of abdominal pain. EXAM: CT ABDOMEN AND PELVIS WITH CONTRAST TECHNIQUE: Multidetector CT imaging of the abdomen and pelvis was performed using the standard protocol following bolus administration of intravenous contrast. RADIATION DOSE REDUCTION: This exam was performed according to the departmental dose-optimization program which includes automated exposure control, adjustment of the mA and/or kV according to patient size and/or use of iterative reconstruction technique. CONTRAST:  159m OMNIPAQUE IOHEXOL 300 MG/ML  SOLN COMPARISON:  None available FINDINGS: Lower chest: Unremarkable Hepatobiliary: Mild fissural widening of hepatic fissures. Suggestion of mild steatosis. Post cholecystectomy. No signs of biliary duct distension. No focal, suspicious hepatic lesion. The portal vein is patent. Pancreas: Normal, without mass, inflammation or ductal dilatation. Spleen: Top normal splenic size, 11-12 cm. Adrenals/Urinary Tract: Adrenal glands are normal. Renal cyst on the LEFT arises from posterior hilar lip measuring 2.5 cm. Compatible with benign finding for which no additional dedicated imaging follow-up is recommended. No hydronephrosis. Mild perinephric stranding. No perivesical stranding. Stomach/Bowel: Small hiatal hernia. No signs of small bowel dilation. Mild small bowel mesenteric edema. Irregularity of the terminal ileum. Mural stratification of the  terminal ileum just at the ileocecal valve. Diffuse mural stratification of the colon. Colonic wall thickening with the "comb sign" about the colon. Pericolonic stranding. No pneumatosis. Differential enhancement at the rectosigmoid transition where there is lower attenuation of the wall of the rectum than within the colon and still with hyperenhancement of the rectal mucosa. The appendix while mildly thickened is without stranding beyond what is seen elsewhere, compatible with secondary inflammation rather than appendicitis. Vascular/Lymphatic: Patent abdominal vessels on venous phase. Specifically the SMV is patent. No adenopathy but with scattered lymph nodes throughout the retroperitoneum which are favored to be reactive. Reproductive: Calcifications of the vas deferens. Otherwise unremarkable appearance of the reproductive structures. Other: No ascites.  No pneumoperitoneum. Musculoskeletal: No acute or destructive bone process. IMPRESSION: 1. Marked pancolitis with potential ileitis favor inflammatory bowel disease. Would also correlate with any risk factors for infectious colitis such as C difficile. GI consultation is suggested. 2. Variable enhancement is noted with respect to the rectum but still with mucosal enhancement on the current study. Would correlate with lactate to exclude early ischemia given hypoenhancement of the rectal wall, though this may reflect differential perfusion at the time of scan related to differing vascular distribution. Close follow-up is suggested with trending of lactate. 3. Findings of potential liver disease  with fissural widening and mild hepatic steatosis with borderline splenomegaly should be correlated with any clinical or laboratory evidence of liver disease. Electronically Signed   By: Zetta Bills M.D.   On: 05/04/2022 13:43    Labs on Admission: I have personally reviewed following labs  CBC: Recent Labs  Lab 05/04/22 1036  WBC 14.1*  NEUTROABS 9.2*  HGB  15.2  HCT 43.4  MCV 84.6  PLT 975   Basic Metabolic Panel: Recent Labs  Lab 05/04/22 1036  NA 128*  K 3.0*  CL 96*  CO2 13*  GLUCOSE 354*  BUN 28*  CREATININE 1.32*  CALCIUM 8.2*   GFR: Estimated Creatinine Clearance: 52.3 mL/min (A) (by C-G formula based on SCr of 1.32 mg/dL (H)).  Liver Function Tests: Recent Labs  Lab 05/04/22 1036  AST 11*  ALT 14  ALKPHOS 55  BILITOT 1.9*  PROT 7.6  ALBUMIN 3.2*   Dr. Tobie Poet [facility]  If 7PM-7AM, please contact overnight-coverage provider If 7AM-7PM, please contact day coverage provider [email or web address]  05/04/2022, 9:57 PM

## 2022-05-04 NOTE — Discharge Instructions (Signed)
Go to [facility] Emergency Department for further work-up and evaluation of your symptoms. I have attached a copy of your EKG and your blood sugar 338 and Blood Pressure 112/73

## 2022-05-04 NOTE — Assessment & Plan Note (Addendum)
Due to [place] dysentery. -- Management as outlined

## 2022-05-04 NOTE — Hospital Course (Addendum)
[name] is a 61 year old male with history of insulin-dependent diabetes mellitus, hyperlipidemia, ED, who presented to the ED on 05/04/2022 for evaluation of of diarrhea several times per day for 5 days in addition of nausea and vomiting.  In the ED, patient was afebrile, tachycardic, otherwise stable vitals.  Labs notable for hyponatremia, hypokalemia, creatinine 1.32, hyperglycemia with nonfasting glucose 354, anion gap metabolic acidosis  CT abdomen pelvis with contrast showed marked pancolitis with potential ileitis favor inflammatory bowel disease versus infectious etiology.  Admitted to the hospital for further evaluation and management.  Stool studies subsequently returned positive for Shigella/EIEC.  Blood cultures subsequently positive for E. coli.  Initially on IV Cipro, changed to 2 g IV Rocephin.  Infectious disease following.   7/1 -- ongoing hypokalemia despite replacement.  Switch to IV replacement this afternoon.   Hopeful to d/c tomorrow.

## 2022-05-04 NOTE — Assessment & Plan Note (Addendum)
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 Negative for gonorrhea and chlamydia. RPR nonreactive. Counseled on admission regarding safe sex practices including use of barrier protection.   Will be followed outpatient by ID.

## 2022-05-04 NOTE — ED Notes (Signed)
Patient is being discharged from the Urgent Care and sent to the Emergency Department via personal vehicle . Per Lavell Anchors NP, patient is in need of higher level of care due to abnormal EKG and hypergylcemia. Patient is aware and verbalizes understanding of plan of care.  Vitals:   05/04/22 0832 05/04/22 0834  BP: 112/73   Pulse:  (!) 120  Resp: 18   Temp: 98.5 F (36.9 C)   SpO2: 96%

## 2022-05-04 NOTE — ED Triage Notes (Signed)
[facility] [place] with c/o NVD since Saturday. States he is having red blood with the stools. Denies any abd pain. Pt had finger stick and EKG performed at urgent care.

## 2022-05-04 NOTE — ED Notes (Signed)
Critical Result from stool sample: Scigella/ Entero-invasive E. Eulogio Bear, MD aware

## 2022-05-04 NOTE — Assessment & Plan Note (Addendum)
Continue simvastatin. 

## 2022-05-04 NOTE — Assessment & Plan Note (Addendum)
Home regimen appears to be Levemir 60 to 120 units daily or Semglee 100 units at bedtime (he apparently uses them interchangeably's), metformin. -- Hold metformin --Start with 60 units Semglee at bedtime --Sliding scale NovoLog -- Adjust insulin for goal 140-180

## 2022-05-04 NOTE — ED Provider Notes (Signed)
Phillip Edwards    CSN: [id number] Arrival date & time: 05/04/22  3382      History   Chief Complaint Chief Complaint  Patient presents with   Diarrhea    HPI Phillip Edwards is a 61 y.o. male.   HPI Patient with a history of uncontrolled diabetes (A1C 11.1) and hyperlipidemia presents today with a five day history of diarrhea and self reported weight loss of 10 lbs. He was seen by his PCP who recommended ER evaluation vs watch and wait, however, patient opted to wait overnight reports continued diarrhea and is here today for evaluation. On arrival , patient is tachycardic and BP normal although soft.   Past Medical History:  Diagnosis Date   Diabetes mellitus without complication ([place])    Hyperlipidemia     Patient Active Problem List   Diagnosis Date Noted   Hyperlipidemia 03/05/2015   Type 2 diabetes mellitus without complication ([place]) 50/53/9767    Past Surgical History:  Procedure Laterality Date   COLONOSCOPY WITH PROPOFOL N/A 05/25/2017   Procedure: COLONOSCOPY WITH PROPOFOL;  Surgeon: Lollie Sails, MD;  Location: [facility] ENDOSCOPY;  Service: Endoscopy;  Laterality: N/A;       Home Medications    Prior to Admission medications   Medication Sig Start Date End Date Taking? Authorizing Provider  Cetirizine HCl 10 MG CAPS Take 10 mg by mouth daily.    [provider]  fenofibrate 160 MG tablet Take 160 mg by mouth daily.    [provider]  Insulin Glargine (LANTUS) 100 UNIT/ML Solostar Pen Inject 50-100 Units into the skin. Daily as directed    [provider]  metFORMIN (GLUCOPHAGE) 1000 MG tablet Take 1,000 mg by mouth 2 (two) times daily with a meal.    [provider]  saxagliptin HCl (ONGLYZA) 5 MG TABS tablet Take by mouth daily.    [provider]  sildenafil (VIAGRA) 100 MG tablet Take by mouth. 05/03/22   [provider]  simvastatin (ZOCOR) 40 MG tablet Take 40 mg by mouth daily.     [provider]    Family History History reviewed. No pertinent family history.  Social History Social History   Tobacco Use   Smoking status: Never   Smokeless tobacco: Never  Vaping Use   Vaping Use: Never used  Substance Use Topics   Alcohol use: Yes    Alcohol/week: 4.0 standard drinks of alcohol    Types: 2 Cans of beer, 2 Standard drinks or equivalent per week   Drug use: No     Allergies   Patient has no known allergies.   Review of Systems Review of Systems   Physical Exam Triage Vital Signs ED Triage Vitals  Enc Vitals Group     BP 05/04/22 0832 112/73     Pulse Rate 05/04/22 0834 (!) 120     Resp 05/04/22 0832 18     Temp 05/04/22 0832 98.5 F (36.9 C)     Temp Source 05/04/22 0832 Oral     SpO2 05/04/22 0832 96 %     Weight --      Height --      Head Circumference --      Peak Flow --      Pain Score 05/04/22 0831 0     Pain Loc --      Pain Edu? --      Excl. in [place]? --    No data found.  Updated Vital  Signs BP 112/73 (BP Location: Left Arm)   Pulse (!) 120   Temp 98.5 F (36.9 C) (Oral)   Resp 18   SpO2 96%   Visual Acuity Right Eye Distance:   Left Eye Distance:   Bilateral Distance:    Right Eye Near:   Left Eye Near:    Bilateral Near:     Physical Exam Vitals reviewed.  Constitutional:      Appearance: He is underweight. He is ill-appearing.  HENT:     Head: Normocephalic and atraumatic.  Cardiovascular:     Rate and Rhythm: Regular rhythm. Tachycardia present.  Abdominal:     Edwards: There is distension.     Palpations: Abdomen is rigid.     Tenderness: There is no abdominal tenderness.  Neurological:     Mental Status: He is alert and oriented to person, place, and time.     GCS: GCS eye subscore is 4. GCS verbal subscore is 5. GCS motor subscore is 6.  Psychiatric:        Attention and Perception: Attention normal.        Mood and Affect: Mood normal.        Speech: Speech normal.      UC  Treatments / Results  Labs (all labs ordered are listed, but only abnormal results are displayed) Labs Reviewed  POCT FASTING CBG [facility] MANUAL ENTRY - Abnormal; Notable for the following components:      Result Value   POCT Glucose ([facility]) 338 (*)    All other components within normal limits    EKG Sinus Tachycardia RBBB   Radiology No results found.  Procedures Procedures (including critical care time)  Medications Ordered in UC Medications - No data to display  Initial Impression / Assessment and Plan / UC Course  I have reviewed the triage vital signs and the nursing notes.  Pertinent labs & imaging results that were available during my care of the patient were reviewed by me and considered in my medical decision making (see chart for details).    Patient presents today and with a 5 day history of persistent diarrhea, (per prior PCP note dated 6/28 with occasional blood noted in stool), vomiting and poor food intake with marked weight loss 10 lbs (confirmed wt loss CPE in May 2023 wt 152 lbs., yesterday 141 lb). ECG revealed tachycardia 121 with RBBB, blood glucose 338, given possible differentials of GI bleed, gastroparesis, diverticular disease, cholecystitis , DKA, patient warrants further evaluation in the setting of the ER for work-up of etiology of symptoms. Patient is stable for personal vehicle transport. Discussed at length the importance of ER evaluation and patient verbalized understanding and agreement with plan. Final Clinical Impressions(s) / UC Diagnoses   Final diagnoses:  Tachycardia  Diarrhea, unspecified type  Unintentional weight loss  Hyperglycemia     Discharge Instructions      Go to [facility] Emergency Department for further work-up and evaluation of your symptoms. I have attached a copy of your EKG and your blood sugar 338 and Blood Pressure 112/73     ED Prescriptions   None    PDMP not reviewed this encounter.   Scot Jun, [place] 05/04/22 [phone]

## 2022-05-05 DIAGNOSIS — E43 Unspecified severe protein-calorie malnutrition: Secondary | ICD-10-CM | POA: Diagnosis present

## 2022-05-05 DIAGNOSIS — R7881 Bacteremia: Secondary | ICD-10-CM | POA: Diagnosis present

## 2022-05-05 DIAGNOSIS — B20 Human immunodeficiency virus [HIV] disease: Secondary | ICD-10-CM | POA: Diagnosis not present

## 2022-05-05 DIAGNOSIS — E871 Hypo-osmolality and hyponatremia: Secondary | ICD-10-CM | POA: Diagnosis present

## 2022-05-05 DIAGNOSIS — N179 Acute kidney failure, unspecified: Secondary | ICD-10-CM | POA: Diagnosis present

## 2022-05-05 DIAGNOSIS — E860 Dehydration: Secondary | ICD-10-CM | POA: Diagnosis present

## 2022-05-05 DIAGNOSIS — Z7251 High risk heterosexual behavior: Secondary | ICD-10-CM | POA: Diagnosis not present

## 2022-05-05 DIAGNOSIS — A09 Infectious gastroenteritis and colitis, unspecified: Secondary | ICD-10-CM | POA: Diagnosis not present

## 2022-05-05 DIAGNOSIS — A042 Enteroinvasive Escherichia coli infection: Secondary | ICD-10-CM | POA: Diagnosis present

## 2022-05-05 DIAGNOSIS — E785 Hyperlipidemia, unspecified: Secondary | ICD-10-CM | POA: Diagnosis present

## 2022-05-05 DIAGNOSIS — E861 Hypovolemia: Secondary | ICD-10-CM | POA: Diagnosis present

## 2022-05-05 DIAGNOSIS — R197 Diarrhea, unspecified: Secondary | ICD-10-CM | POA: Diagnosis present

## 2022-05-05 DIAGNOSIS — E872 Acidosis, unspecified: Secondary | ICD-10-CM | POA: Diagnosis present

## 2022-05-05 DIAGNOSIS — Z79899 Other long term (current) drug therapy: Secondary | ICD-10-CM | POA: Diagnosis not present

## 2022-05-05 DIAGNOSIS — Z7984 Long term (current) use of oral hypoglycemic drugs: Secondary | ICD-10-CM | POA: Diagnosis not present

## 2022-05-05 DIAGNOSIS — Z21 Asymptomatic human immunodeficiency virus [HIV] infection status: Secondary | ICD-10-CM | POA: Diagnosis present

## 2022-05-05 DIAGNOSIS — E8729 Other acidosis: Secondary | ICD-10-CM | POA: Diagnosis present

## 2022-05-05 DIAGNOSIS — E876 Hypokalemia: Secondary | ICD-10-CM | POA: Diagnosis present

## 2022-05-05 DIAGNOSIS — A030 Shigellosis due to Shigella dysenteriae: Secondary | ICD-10-CM | POA: Diagnosis present

## 2022-05-05 DIAGNOSIS — Z794 Long term (current) use of insulin: Secondary | ICD-10-CM | POA: Diagnosis not present

## 2022-05-05 DIAGNOSIS — K529 Noninfective gastroenteritis and colitis, unspecified: Secondary | ICD-10-CM

## 2022-05-05 DIAGNOSIS — E1165 Type 2 diabetes mellitus with hyperglycemia: Secondary | ICD-10-CM | POA: Diagnosis present

## 2022-05-05 DIAGNOSIS — B9620 Unspecified Escherichia coli [E. coli] as the cause of diseases classified elsewhere: Secondary | ICD-10-CM

## 2022-05-05 DIAGNOSIS — K5100 Ulcerative (chronic) pancolitis without complications: Secondary | ICD-10-CM | POA: Diagnosis present

## 2022-05-05 DIAGNOSIS — Z6820 Body mass index (BMI) 20.0-20.9, adult: Secondary | ICD-10-CM | POA: Diagnosis not present

## 2022-05-05 HISTORY — DX: Hypo-osmolality and hyponatremia: E87.1

## 2022-05-05 HISTORY — DX: Other disorders of phosphorus metabolism: E83.39

## 2022-05-05 HISTORY — DX: Hypokalemia: E87.6

## 2022-05-05 LAB — BLOOD CULTURE ID PANEL (REFLEXED) - BCID2

## 2022-05-05 LAB — GASTROINTESTINAL PANEL BY PCR, STOOL (REPLACES STOOL CULTURE)

## 2022-05-05 LAB — BASIC METABOLIC PANEL
Anion gap: 8 (ref 5–15)
BUN: 15 mg/dL (ref 6–20)
CO2: 20 mmol/L — ABNORMAL LOW (ref 22–32)
Calcium: 7.6 mg/dL — ABNORMAL LOW (ref 8.9–10.3)
Chloride: 104 mmol/L (ref 98–111)
Creatinine, Ser: 0.74 mg/dL (ref 0.61–1.24)
GFR, Estimated: >60 mL/min (ref >60)
Glucose, Bld: 122 mg/dL — ABNORMAL HIGH (ref 70–99)
Potassium: 2.3 mmol/L — CL (ref 3.5–5.1)
Sodium: 132 mmol/L — ABNORMAL LOW (ref 135–145)

## 2022-05-05 LAB — GLUCOSE, CAPILLARY
Glucose-Capillary: 134 mg/dL — ABNORMAL HIGH (ref 70–99)
Glucose-Capillary: 187 mg/dL — ABNORMAL HIGH (ref 70–99)
Glucose-Capillary: 269 mg/dL — ABNORMAL HIGH (ref 70–99)
Glucose-Capillary: 270 mg/dL — ABNORMAL HIGH (ref 70–99)

## 2022-05-05 LAB — CBC
HCT: 33.8 % — ABNORMAL LOW (ref 39.0–52.0)
Hemoglobin: 12 g/dL — ABNORMAL LOW (ref 13.0–17.0)
MCH: 29.1 pg (ref 26.0–34.0)
MCHC: 35.5 g/dL (ref 30.0–36.0)
MCV: 81.8 fL (ref 80.0–100.0)
Platelets: 234 10*3/uL (ref 150–400)
RBC: 4.13 MIL/uL — ABNORMAL LOW (ref 4.22–5.81)
RDW: 12.7 % (ref 11.5–15.5)
WBC: 12.2 10*3/uL — ABNORMAL HIGH (ref 4.0–10.5)
nRBC: 0 % (ref 0.0–0.2)

## 2022-05-05 LAB — URINALYSIS, COMPLETE (UACMP) WITH MICROSCOPIC
Bacteria, UA: NONE SEEN
Bilirubin Urine: NEGATIVE
Glucose, UA: 150 mg/dL — AB
Ketones, ur: 20 mg/dL — AB
Leukocytes,Ua: NEGATIVE
Nitrite: NEGATIVE
Protein, ur: 30 mg/dL — AB
Specific Gravity, Urine: 1.027 (ref 1.005–1.030)
Squamous Epithelial / HPF: NONE SEEN (ref 0–5)
pH: 6 (ref 5.0–8.0)

## 2022-05-05 LAB — HIV ANTIBODY (ROUTINE TESTING W REFLEX): HIV Screen 4th Generation wRfx: REACTIVE — AB

## 2022-05-05 LAB — RPR: RPR Ser Ql: NONREACTIVE

## 2022-05-05 LAB — MAGNESIUM: Magnesium: 2.2 mg/dL (ref 1.7–2.4)

## 2022-05-05 LAB — CHLAMYDIA/NGC RT PCR (ARMC ONLY)
Chlamydia Tr: NOT DETECTED
N gonorrhoeae: NOT DETECTED

## 2022-05-05 LAB — PHOSPHORUS: Phosphorus: 1.1 mg/dL — ABNORMAL LOW (ref 2.5–4.6)

## 2022-05-05 LAB — PROTIME-INR
INR: 1.2 (ref 0.8–1.2)
Prothrombin Time: 15.3 seconds — ABNORMAL HIGH (ref 11.4–15.2)

## 2022-05-05 LAB — PROCALCITONIN: Procalcitonin: 1.04 ng/mL

## 2022-05-05 LAB — POTASSIUM: Potassium: 2.9 mmol/L — ABNORMAL LOW (ref 3.5–5.1)

## 2022-05-05 LAB — CORTISOL-AM, BLOOD: Cortisol - AM: 16.6 ug/dL (ref 6.7–22.6)

## 2022-05-05 MED ORDER — GLUCERNA SHAKE PO LIQD
237.0000 mL | Freq: Three times a day (TID) | ORAL | Status: DC
  Administered 2022-05-05 – 2022-05-08 (×6): 237 mL via ORAL

## 2022-05-05 MED ORDER — POTASSIUM CHLORIDE CRYS ER 20 MEQ PO TBCR
20.0000 meq | EXTENDED_RELEASE_TABLET | Freq: Once | ORAL | Status: AC
  Administered 2022-05-05: 20 meq via ORAL
  Filled 2022-05-05: qty 1

## 2022-05-05 MED ORDER — SODIUM CHLORIDE 0.9 % IV SOLN: INTRAVENOUS | Status: DC

## 2022-05-05 MED ORDER — CEFTRIAXONE SODIUM 2 G IJ SOLR
2.0000 g | INTRAMUSCULAR | Status: DC
  Administered 2022-05-05 – 2022-05-07 (×3): 2 g via INTRAVENOUS
  Filled 2022-05-05 (×4): qty 20

## 2022-05-05 MED ORDER — POTASSIUM CHLORIDE CRYS ER 20 MEQ PO TBCR
40.0000 meq | EXTENDED_RELEASE_TABLET | ORAL | Status: AC
  Administered 2022-05-05 (×3): 40 meq via ORAL
  Filled 2022-05-05 (×3): qty 2

## 2022-05-05 MED ORDER — POTASSIUM PHOSPHATES 15 MMOLE/5ML IV SOLN
30.0000 mmol | Freq: Once | INTRAVENOUS | Status: AC
  Administered 2022-05-05: 30 mmol via INTRAVENOUS
  Filled 2022-05-05: qty 10

## 2022-05-05 MED ORDER — INSULIN GLARGINE-YFGN 100 UNIT/ML ~~LOC~~ SOLN
60.0000 [IU] | Freq: Every day | SUBCUTANEOUS | Status: DC
  Administered 2022-05-05: 60 [IU] via SUBCUTANEOUS
  Filled 2022-05-05 (×2): qty 0.6

## 2022-05-05 MED ORDER — POTASSIUM CHLORIDE 10 MEQ/100ML IV SOLN
10.0000 meq | INTRAVENOUS | Status: AC
  Administered 2022-05-05 (×4): 10 meq via INTRAVENOUS
  Filled 2022-05-05 (×4): qty 100

## 2022-05-05 MED ORDER — ADULT MULTIVITAMIN W/MINERALS CH
1.0000 | ORAL_TABLET | Freq: Every day | ORAL | Status: DC
  Administered 2022-05-05 – 2022-05-07 (×4): 1 via ORAL
  Filled 2022-05-05 (×4): qty 1

## 2022-05-05 NOTE — Consult Note (Signed)
NAME: Phillip Edwards  DOB: 1961/05/17  MRN: [id number]  Date/Time: 05/05/2022 11:28 AM  REQUESTING PROVIDER: Dr. Arbutus Ped Subjective:  REASON FOR CONSULT: HIV reactive  bacteremia ? Phillip Edwards is a 61 y.o. male with a history of DM, HLD, past h/o STDs Presents with diarrhea of 5 days As per patient he developed diarrhea 5 days ago and has had about 20 episodes a day After the first 2 days he noticed some blood in the stool He had some abdominal discomfort No fever.  He was very tired. He does not room by eating any specific food that making pancakes from powder  Vitals in the ED.  Temperature 98.8, BP 94/63, pulse 109, respiratory 20 and sats 97%. WBC 14.1, Hb 15.2, platelet 268 and creatinine 1.32 HIV test here is reactive and I am seeing the patient for the same Patient also had E. coli bacteremia and has E. coli in the stool.  He is currently on ciprofloxacin  He is sexually active with a male partner.  His wife died 2 years ago He says a month ago he tested negative for HIV    Past Medical History:  Diagnosis Date   Diabetes mellitus without complication ([place])    Hyperlipidemia     Past Surgical History:  Procedure Laterality Date   COLONOSCOPY WITH PROPOFOL N/A 05/25/2017   Procedure: COLONOSCOPY WITH PROPOFOL;  Surgeon: Lollie Sails, MD;  Location: [facility];  Service: Endoscopy;  Laterality: N/A;    Social History   Socioeconomic History   Marital status: Widowed    Spouse name: Not on file   Number of children: Not on file   Years of education: Not on file   Highest education level: Not on file  Occupational History   Not on file  Tobacco Use   Smoking status: Never   Smokeless tobacco: Never  Vaping Use   Vaping Use: Never used  Substance and Sexual Activity   Alcohol use: Yes    Alcohol/week: 4.0 standard drinks of alcohol    Types: 2 Cans of beer, 2 Standard drinks or equivalent per week   Drug use: No   Sexual activity: Yes     Partners: Male, Male    Birth control/protection: None  Other Topics Concern   Not on file  Social History Narrative   Not on file   Social Determinants of Health   Financial Resource Strain: Not on file  Food Insecurity: Not on file  Transportation Needs: Not on file  Physical Activity: Not on file  Stress: Not on file  Social Connections: Not on file  Intimate Partner Violence: Not At Risk (12/28/2020)   Humiliation, Afraid, Rape, and Kick questionnaire    Fear of Current or Ex-Partner: No    Emotionally Abused: No    Physically Abused: No    Sexually Abused: No    History reviewed. No pertinent family history. No Known Allergies I? Current Facility-Administered Medications  Medication Dose Route Frequency Provider Last Rate Last Admin   acetaminophen (TYLENOL) tablet 650 mg  650 mg Oral Q6H PRN Cox, Amy N, DO   650 mg at 05/05/22 6440   Or   acetaminophen (TYLENOL) suppository 650 mg  650 mg Rectal Q6H PRN Cox, Amy N, DO       cefTRIAXone (ROCEPHIN) 2 g in sodium chloride 0.9 % 100 mL IVPB  2 g Intravenous Q24H Nicole Kindred A, DO       enoxaparin (LOVENOX) injection 40 mg  40  mg Subcutaneous QHS Cox, Amy N, DO       insulin aspart (novoLOG) injection 0-15 Units  0-15 Units Subcutaneous TID WC Cox, Amy N, DO   2 Units at 05/05/22 0943   insulin aspart (novoLOG) injection 0-5 Units  0-5 Units Subcutaneous QHS Cox, Amy N, DO       insulin glargine-yfgn (SEMGLEE) injection 100 Units  100 Units Subcutaneous QHS Cox, Amy N, DO       ondansetron (ZOFRAN) tablet 4 mg  4 mg Oral Q6H PRN Cox, Amy N, DO       Or   ondansetron (ZOFRAN) injection 4 mg  4 mg Intravenous Q6H PRN Cox, Amy N, DO   4 mg at 05/04/22 1647   potassium chloride SA (KLOR-CON M) CR tablet 40 mEq  40 mEq Oral Q4H Nicole Kindred A, DO   40 mEq at 05/05/22 0944   senna-docusate (Senokot-S) tablet 1 tablet  1 tablet Oral QHS PRN Cox, Amy N, DO       simvastatin (ZOCOR) tablet 40 mg  40 mg Oral Daily Cox, Amy N,  DO   40 mg at 05/05/22 0944     Abtx:  Anti-infectives (From admission, onward)    Start     Dose/Rate Route Frequency Ordered Stop   05/05/22 1200  cefTRIAXone (ROCEPHIN) 2 g in sodium chloride 0.9 % 100 mL IVPB        2 g 200 mL/hr over 30 Minutes Intravenous Every 24 hours 05/05/22 0848     05/05/22 0300  ciprofloxacin (CIPRO) IVPB 400 mg  Status:  Discontinued        400 mg 200 mL/hr over 60 Minutes Intravenous Every 12 hours 05/04/22 2145 05/05/22 0848   05/05/22 0200  ciprofloxacin (CIPRO) IVPB 400 mg  Status:  Discontinued        400 mg 200 mL/hr over 60 Minutes Intravenous Every 12 hours 05/04/22 2004 05/04/22 2145   05/04/22 1430  ciprofloxacin (CIPRO) IVPB 400 mg        400 mg 200 mL/hr over 60 Minutes Intravenous  Once 05/04/22 1420 05/04/22 1551   05/04/22 1430  metroNIDAZOLE (FLAGYL) IVPB 500 mg  Status:  Discontinued        500 mg 100 mL/hr over 60 Minutes Intravenous  Once 05/04/22 1420 05/04/22 1515       REVIEW OF SYSTEMS:  Const: negative fever, negative chills, negative weight loss Eyes: negative diplopia or visual changes, negative eye pain ENT: negative coryza, negative sore throat Resp: negative cough, hemoptysis, dyspnea Cards: negative for chest pain, palpitations, lower extremity edema GU: negative for frequency, dysuria and hematuria GI: As above Skin: negative for rash and pruritus Heme: negative for easy bruising and gum/nose bleeding MS: negative for myalgias, arthralgias, back pain and muscle weakness Neurolo:negative for headaches, dizziness, vertigo, memory problems  Psych: negative for feelings of anxiety, depression  Endocrine: Diabetes Allergy/Immunology- negative for any medication or food allergies  Objective:  VITALS:  BP 114/66 (BP Location: Right Arm)   Pulse 98   Temp 98.9 F (37.2 C) (Oral)   Resp 18   Ht '5\' 9"'$  (1.753 m)   Wt 62.1 kg   SpO2 99%   BMI 20.23 kg/m   General: Alert, cooperative, no distress, appears stated  age.  Head: Normocephalic, without obvious abnormality, atraumatic. Eyes: Conjunctivae clear, anicteric sclerae. Pupils are equal ENT Nares normal. No drainage or sinus tenderness. Lips, mucosa, and tongue normal. No Thrush Neck: Supple, symmetrical, no adenopathy, thyroid:  non tender no carotid bruit and no JVD. Back: No CVA tenderness. Lungs: Clear to auscultation bilaterally. No Wheezing or Rhonchi. No rales. Heart: Regular rate and rhythm, no murmur, rub or gallop. Abdomen: Soft, non-tender,not distended. Bowel sounds normal. No masses Extremities: atraumatic, no cyanosis. No edema. No clubbing Skin: No rashes or lesions. Or bruising Lymph: Cervical, supraclavicular normal. Neurologic: Grossly non-focal Pertinent Labs Lab Results CBC    Component Value Date/Time   WBC 12.2 (H) 05/05/2022 0540   RBC 4.13 (L) 05/05/2022 0540   HGB 12.0 (L) 05/05/2022 0540   HCT 33.8 (L) 05/05/2022 0540   PLT 234 05/05/2022 0540   MCV 81.8 05/05/2022 0540   MCH 29.1 05/05/2022 0540   MCHC 35.5 05/05/2022 0540   RDW 12.7 05/05/2022 0540   LYMPHSABS 1.6 05/04/2022 1036   MONOABS 2.3 (H) 05/04/2022 1036   EOSABS 0.0 05/04/2022 1036   BASOSABS 0.0 05/04/2022 1036       Latest Ref Rng & Units 05/05/2022    5:40 AM 05/04/2022   10:36 AM  CMP  Glucose 70 - 99 mg/dL 122  354   BUN 6 - 20 mg/dL 15  28   Creatinine 0.61 - 1.24 mg/dL 0.74  1.32   Sodium 135 - 145 mmol/L 132  128   Potassium 3.5 - 5.1 mmol/L 2.3  3.0   Chloride 98 - 111 mmol/L 104  96   CO2 22 - 32 mmol/L 20  13   Calcium 8.9 - 10.3 mg/dL 7.6  8.2   Total Protein 6.5 - 8.1 g/dL  7.6   Total Bilirubin 0.3 - 1.2 mg/dL  1.9   Alkaline Phos 38 - 126 U/L  55   AST 15 - 41 U/L  11   ALT 0 - 44 U/L  14       Microbiology: Recent Results (from the past 240 hour(s))  C Difficile Quick Screen w PCR reflex     Status: None   Collection Time: 05/04/22 12:20 PM   Specimen: STOOL  Result Value Ref Range Status   C Diff antigen  NEGATIVE NEGATIVE Final   C Diff toxin NEGATIVE NEGATIVE Final   C Diff interpretation No C. difficile detected.  Final    Comment: Performed at [facility], [address] [place], [place] [address]  Gastrointestinal Panel by PCR , Stool     Status: Abnormal   Collection Time: 05/04/22 12:20 PM   Specimen: Stool  Result Value Ref Range Status   Campylobacter species NOT DETECTED NOT DETECTED Final   Plesimonas shigelloides NOT DETECTED NOT DETECTED Final   Salmonella species NOT DETECTED NOT DETECTED Final   Yersinia enterocolitica NOT DETECTED NOT DETECTED Final   Vibrio species NOT DETECTED NOT DETECTED Final   Vibrio cholerae NOT DETECTED NOT DETECTED Final   Enteroaggregative E coli (EAEC) NOT DETECTED NOT DETECTED Final   Enteropathogenic E coli (EPEC) NOT DETECTED NOT DETECTED Final   Enterotoxigenic E coli (ETEC) NOT DETECTED NOT DETECTED Final   Shiga like toxin producing E coli (STEC) NOT DETECTED NOT DETECTED Final   Shigella/Enteroinvasive E coli (EIEC) DETECTED (A) NOT DETECTED Corrected    Comment: RESULT CALLED TO, READ BACK BY AND VERIFIED WITH: LINDSAY BLACK ON 05/04/22 AT 1425 DE CORRECTED ON 06/30 AT 0433: PREVIOUSLY REPORTED AS DETECTED LINDSAY BLACK 05/04/22 1425 DE    Cryptosporidium NOT DETECTED NOT DETECTED Final   Cyclospora cayetanensis NOT DETECTED NOT DETECTED Final   Entamoeba histolytica NOT DETECTED NOT DETECTED  Final   Giardia lamblia NOT DETECTED NOT DETECTED Final   Adenovirus F40/41 NOT DETECTED NOT DETECTED Final   Astrovirus NOT DETECTED NOT DETECTED Final   Norovirus GI/GII NOT DETECTED NOT DETECTED Final   Rotavirus A NOT DETECTED NOT DETECTED Final   Sapovirus (I, II, IV, and V) NOT DETECTED NOT DETECTED Final    Comment: Performed at [facility], [address] [place], [place] [address]  Culture, blood (routine x 2)     Status: None (Preliminary result)   Collection Time: 05/04/22  2:41 PM   Specimen: BLOOD  Result  Value Ref Range Status   Specimen Description BLOOD LEFT HAND  Final   Special Requests   Final    BOTTLES DRAWN AEROBIC AND ANAEROBIC Blood Culture results may not be optimal due to an inadequate volume of blood received in culture bottles   Culture   Final    NO GROWTH < 24 HOURS Performed at [facility], [address] [place], [place] [address]    Report Status PENDING  Incomplete  Culture, blood (routine x 2)     Status: None (Preliminary result)   Collection Time: 05/04/22  2:41 PM   Specimen: BLOOD  Result Value Ref Range Status   Specimen Description BLOOD LEFT AC  Final   Special Requests   Final    BOTTLES DRAWN AEROBIC AND ANAEROBIC Blood Culture adequate volume   Culture  Setup Time   Final    Organism ID to follow GRAM NEGATIVE RODS IN BOTH AEROBIC AND ANAEROBIC BOTTLES CRITICAL RESULT CALLED TO, READ BACK BY AND VERIFIED WITH: NATHAN BELUTH '@0500'$  ON 05/05/2022.Marland KitchenMarland KitchenTKR Performed at [facility], [address] [place], [place] [address]    Culture GRAM NEGATIVE RODS  Final   Report Status PENDING  Incomplete  Blood Culture ID Panel (Reflexed)     Status: Abnormal   Collection Time: 05/04/22  2:41 PM  Result Value Ref Range Status   Enterococcus faecalis NOT DETECTED NOT DETECTED Final   Enterococcus Faecium NOT DETECTED NOT DETECTED Final   Listeria monocytogenes NOT DETECTED NOT DETECTED Final   Staphylococcus species NOT DETECTED NOT DETECTED Final   Staphylococcus aureus (BCID) NOT DETECTED NOT DETECTED Final   Staphylococcus epidermidis NOT DETECTED NOT DETECTED Final   Staphylococcus lugdunensis NOT DETECTED NOT DETECTED Final   Streptococcus species NOT DETECTED NOT DETECTED Final   Streptococcus agalactiae NOT DETECTED NOT DETECTED Final   Streptococcus pneumoniae NOT DETECTED NOT DETECTED Final   Streptococcus pyogenes NOT DETECTED NOT DETECTED Final   A.calcoaceticus-baumannii NOT DETECTED NOT DETECTED Final   Bacteroides fragilis NOT  DETECTED NOT DETECTED Final   Enterobacterales DETECTED (A) NOT DETECTED Final    Comment: Enterobacterales represent a large order of gram negative bacteria, not a single organism. CRITICAL RESULT CALLED TO, READ BACK BY AND VERIFIED WITH: NATHAN BELUTH '@0500'$  ON 05/05/2022.Marland KitchenMarland KitchenTKR    Enterobacter cloacae complex NOT DETECTED NOT DETECTED Final   Escherichia coli DETECTED (A) NOT DETECTED Final    Comment: CRITICAL RESULT CALLED TO, READ BACK BY AND VERIFIED WITH: NATHAN BELUTH '@0500'$  ON 05/05/2022.Marland KitchenMarland KitchenTKR    Klebsiella aerogenes NOT DETECTED NOT DETECTED Final   Klebsiella oxytoca NOT DETECTED NOT DETECTED Final   Klebsiella pneumoniae NOT DETECTED NOT DETECTED Final   Proteus species NOT DETECTED NOT DETECTED Final   Salmonella species NOT DETECTED NOT DETECTED Final   Serratia marcescens NOT DETECTED NOT DETECTED Final   Haemophilus influenzae NOT DETECTED NOT DETECTED Final  Neisseria meningitidis NOT DETECTED NOT DETECTED Final   Pseudomonas aeruginosa NOT DETECTED NOT DETECTED Final   Stenotrophomonas maltophilia NOT DETECTED NOT DETECTED Final   Candida albicans NOT DETECTED NOT DETECTED Final   Candida auris NOT DETECTED NOT DETECTED Final   Candida glabrata NOT DETECTED NOT DETECTED Final   Candida krusei NOT DETECTED NOT DETECTED Final   Candida parapsilosis NOT DETECTED NOT DETECTED Final   Candida tropicalis NOT DETECTED NOT DETECTED Final   Cryptococcus neoformans/gattii NOT DETECTED NOT DETECTED Final   CTX-M ESBL NOT DETECTED NOT DETECTED Final   Carbapenem resistance IMP NOT DETECTED NOT DETECTED Final   Carbapenem resistance KPC NOT DETECTED NOT DETECTED Final   Carbapenem resistance NDM NOT DETECTED NOT DETECTED Final   Carbapenem resist OXA 48 LIKE NOT DETECTED NOT DETECTED Final   Carbapenem resistance VIM NOT DETECTED NOT DETECTED Final    Comment: Performed at [facility], [address] [place], [place] [address]  [place] rt PCR [facility] only)      Status: None   Collection Time: 05/05/22  3:18 AM   Specimen: Urine  Result Value Ref Range Status   Specimen source GC/Chlam URINE, RANDOM  Final   Chlamydia Tr NOT DETECTED NOT DETECTED Final   N gonorrhoeae NOT DETECTED NOT DETECTED Final    Comment: (NOTE) This CT/NG assay has not been evaluated in patients with a history of  hysterectomy. Performed at [facility], [address] [place], [place] [address]     IMAGING RESULTS: CT abdomen marked pancolitis with potential ileitis   I have personally reviewed the films ? 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  ? ? ___________________________________________________ Discussed with patient, in detail. ID will follow him peripherally this weekend.  Call if needed. Note:  This document was prepared using Dragon voice recognition software and may include unintentional dictation errors.

## 2022-05-05 NOTE — Progress Notes (Signed)
PHARMACY - PHYSICIAN COMMUNICATION CRITICAL VALUE ALERT - BLOOD CULTURE IDENTIFICATION (BCID)  BCID result:  1 of 4 w/ E coli, no resistance. Pt currently on IV Cipro for Shigella/EIEC positive diarrhea.   Name of provider contacted: Rachael Fee, NP  Changes to prescribed antibiotics required: No changes at this time due to decent coverage for E coli w/ Cipro.  Renda Rolls, PharmD, [facility] 05/05/2022 6:02 AM

## 2022-05-05 NOTE — Assessment & Plan Note (Signed)
Phos 1.1.  Replaced with IV K-phos 30 mmol.  Resolved.

## 2022-05-05 NOTE — Assessment & Plan Note (Signed)
Management as outlined

## 2022-05-05 NOTE — Assessment & Plan Note (Signed)
Present with sodium 128, likely hypovolemic in the setting of diarrhea.  Resolved with IV fluids. --Stop IV fluids -- Monitor BMP

## 2022-05-05 NOTE — Assessment & Plan Note (Signed)
HIV screen on admission was reactive. 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  Acute hepatitis panel was negative. --Started on Biktarvy --CD4 count, genosure are still pending -- Infectious disease follow-up in 2 weeks --Pt instructed to inform any sexual partners and to avoid sex until viral load undetectable, always use barrier protection when resuming sex.

## 2022-05-05 NOTE — Consult Note (Addendum)
PHARMACY CONSULT NOTE - FOLLOW UP  Pharmacy Consult for Electrolyte Monitoring and Replacement   Recent Labs: Potassium (mmol/L)  Date Value  05/05/2022 2.3 (LL)   Calcium (mg/dL)  Date Value  05/05/2022 7.6 (L)   Albumin (g/dL)  Date Value  05/04/2022 3.2 (L)   Sodium (mmol/L)  Date Value  05/05/2022 132 (L)     Assessment: 61 year old male who presents emergency department for chief concerns of diarrhea several times per day for 5 days. Found to have EIEC gastroenteritis. Pharmacy has been consulted to monitor and replace electrolytes as needed.   Goal of Therapy:  Electrolytes WNL   Plan:  Hypokalemia 40 mEQ PO x 3 ordered by MD, Will order additional 10 mEq x 4 given ongoing GI losses Hypocalcemia Moderate. Will reassess in AM Recheck K+ following IV doses at 1800 to ensure K+ trending up. Will order Mag, Phos level at this time also    Dorothe Pea, PharmD, BCPS Clinical Pharmacist   05/05/2022 1:11 PM

## 2022-05-05 NOTE — Assessment & Plan Note (Addendum)
Due to hypovolemia and diarrhea.  Improving, gap closed today and bicarb improved.  Monitor BMP.  Manage underlying conditions as outlined.

## 2022-05-05 NOTE — Assessment & Plan Note (Signed)
Also with E. coli GI infection. -- Treated with 2 g IV Rocephin daily -- Discharge on PO Cipro x 6 days to complete course -- ID consulted

## 2022-05-05 NOTE — Assessment & Plan Note (Signed)
Treated IV Rocephin 2 g daily. Discharge on PO Cipro x 6 days Infectious disease consulted.

## 2022-05-05 NOTE — Assessment & Plan Note (Signed)
Related to acute illness (pancolitis) as evidenced by mild to moderate fat depletion, mild to moderate muscle depletion. --Appreciate dietitian recommendations -- Liberalize diet -- Glucerna shakes 3 times daily -- Multivitamin

## 2022-05-05 NOTE — Consult Note (Signed)
PHARMACY CONSULT NOTE - FOLLOW UP  Pharmacy Consult for Electrolyte Monitoring and Replacement   Recent Labs: Potassium (mmol/L)  Date Value  05/05/2022 2.9 (L)   Magnesium (mg/dL)  Date Value  05/05/2022 2.2   Calcium (mg/dL)  Date Value  05/05/2022 7.6 (L)   Albumin (g/dL)  Date Value  05/04/2022 3.2 (L)   Phosphorus (mg/dL)  Date Value  05/05/2022 1.1 (L)   Sodium (mmol/L)  Date Value  05/05/2022 132 (L)     Assessment: 61 year old male who presents emergency department for chief concerns of diarrhea several times per day for 5 days. Found to have EIEC gastroenteritis. Pharmacy has been consulted to monitor and replace electrolytes as needed.   Goal of Therapy:  Electrolytes WNL   Plan:  Hypokalemia 40 mEQ PO x 3 ordered by MD, Will order additional 10 mEq x 4 given ongoing GI losses Hypocalcemia Moderate. Will reassess in AM Recheck K+ following IV doses at 1800 to ensure K+ trending up. Will order Mag, Phos level at this time also   -6/30 '@1739'$   K 2.9  Mag 2.2  Phos 1.1 MD ordered Potassium Phosphate 30 mmol IV x 1 -will order KCL 20 meq po x 1  -will f/u with am labs    Noralee Space, PharmD Clinical Pharmacist   05/05/2022 8:07 PM

## 2022-05-05 NOTE — Progress Notes (Signed)
Initial Nutrition Assessment  DOCUMENTATION CODES:   Severe malnutrition in context of acute illness/injury  INTERVENTION:   -Liberalize diet to carb modified -Glucerna Shake po TID, each supplement provides 220 kcal and 10 grams of protein  -MVI with minerals daily  NUTRITION DIAGNOSIS:   Severe Malnutrition related to acute illness (pancolitis) as evidenced by mild fat depletion, moderate fat depletion, mild muscle depletion, moderate muscle depletion.  GOAL:   Patient will meet greater than or equal to 90% of their needs  MONITOR:   PO intake, Supplement acceptance  REASON FOR ASSESSMENT:   Malnutrition Screening Tool    ASSESSMENT:   Pt with history of insulin-dependent diabetes mellitus, hyperlipidemia, ED, who presents for chief concerns of diarrhea several times per day for 5 days  Pt admitted with diarrhea and pancolitis.   Reviewed I/O's: +2.6 L x 24 hours  Spoke with pt at bedside, who reports feeling much better today. He shares today is the first time in a week he has had a solid bowel movement. Per pt, pt with decreased oral intake over the past week secondary to diarrhea. Pt consumed about 1/3 of fish, 50% of rice, and cake for lunch, which he tolerated well.   Pt usually consumes 3 meals per day (Breakfast: biscuit; Lunch and dinner: meat, starch, and vegetable, which are usually from restaurants). Per pt, he lives alone and does not cook for himself often.   Per pt, he has lost about 10# over the past week. No wt hx available to assess at this time.   Discussed importance of good meal and supplement intake to promote healing. Pt amenable to supplements.  Medications reviewed and include potassium chloride.   No results found for: "HGBA1C" PTA DM medications are 50-100 units insulin glargine daily, 60-120 units insulin detemir daily, and 1000 mg metformin BID.   Labs reviewed: Na: 132, K: 2.3, CBGS: [phone] (inpatient orders for glycemic control are  0-15 units insulin aspart TID with meals, 0-5 units insulin aspart daily at bedtime, and 60 units inuslin glargine-yfgn daily at bedtime).    NUTRITION - FOCUSED PHYSICAL EXAM:  Flowsheet Row Most Recent Value  Orbital Region Mild depletion  Upper Arm Region Mild depletion  Thoracic and Lumbar Region Mild depletion  Buccal Region Mild depletion  Temple Region Mild depletion  Clavicle Bone Region Moderate depletion  Clavicle and Acromion Bone Region Moderate depletion  Scapular Bone Region Moderate depletion  Dorsal Hand Mild depletion  Patellar Region Moderate depletion  Anterior Thigh Region Moderate depletion  Posterior Calf Region Moderate depletion  Edema (RD Assessment) None  Hair Reviewed  Eyes Reviewed  Mouth Reviewed  Skin Reviewed  Nails Reviewed       Diet Order:   Diet Order             Diet Carb Modified Fluid consistency: Thin; Room service appropriate? Yes  Diet effective now                   EDUCATION NEEDS:   Education needs have been addressed  Skin:  Skin Assessment: Reviewed RN Assessment  Last BM:  05/04/22  Height:   Ht Readings from Last 1 Encounters:  05/04/22 '5\' 9"'$  (1.753 m)    Weight:   Wt Readings from Last 1 Encounters:  05/04/22 62.1 kg    Ideal Body Weight:  72.7 kg  BMI:  Body mass index is 20.23 kg/m.  Estimated Nutritional Needs:   Kcal:  1950-2150  Protein:  110-125 grams  Fluid:  > 1.9 L    Loistine Chance, RD, LDN, [place] Registered Dietitian II Certified Diabetes Care and Education Specialist Please refer to [facility] for RD and/or RD on-call/weekend/after hours pager

## 2022-05-05 NOTE — Assessment & Plan Note (Signed)
Potassium critically low at 2.3 this morning.  Oral replacement ordered. -- Monitor BMP, replace K as needed -- Monitor Mg as well  7/1 - AM K was 3.0 given 40 mEq replacement. Repeat K level afternoon remains at 3.0. Suspect poor GI absorption. --Start LR+20 mEq KCl --30 mEq KCl riders

## 2022-05-05 NOTE — Progress Notes (Addendum)
Progress Note   Patient: Phillip Edwards WEX:[id number] DOB: April 10, 1961 DOA: 05/04/2022     0 DOS: the patient was seen and examined on 05/05/2022   Brief hospital course: Mr. Cervando Durnin is a 61 year old male with history of insulin-dependent diabetes mellitus, hyperlipidemia, ED, who presented to the ED on 05/04/2022 for evaluation of of diarrhea several times per day for 5 days in addition of nausea and vomiting.  In the ED, patient was afebrile, tachycardic, otherwise stable vitals.  Labs notable for hyponatremia, hypokalemia, creatinine 1.32, hyperglycemia with nonfasting glucose 354, anion gap metabolic acidosis  CT abdomen pelvis with contrast showed marked pancolitis with potential ileitis favor inflammatory bowel disease versus infectious etiology.  Admitted to the hospital for further evaluation and management.  Stool studies subsequently returned positive for Shigella/EIEC.  Blood cultures subsequently positive for E. coli.  Initially on IV Cipro, changed to 2 g IV Rocephin.  Infectious disease following.  Assessment and Plan: * Diarrhea GI panel positive for Shigella/EIEC. Treated with IV Cipro in the ED and continued on admission. Antibiotic changed to 2 g IV Rocephin daily. -- Infectious disease following, see their recommendations - Antimotility agents can be ordered once patient has received 24 to 48 hours of IV antibiotic -- Off IV fluids, tolerating diet -- Monitor renal function and electrolytes -- Enteric precautions   Hypophosphatemia Phos 1.1.  Replace with IV K-phos 30 mmol.  Repeat phos level with AM labs.  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 --Confirmatory genotype and viral load pending -- Infectious diseases following  Protein-calorie malnutrition, severe Related to acute illness (pancolitis) as evidenced by mild to moderate fat depletion, mild to moderate muscle depletion. --Appreciate dietitian recommendations -- Liberalize  diet -- Glucerna shakes 3 times daily -- Multivitamin  Bacteremia due to Escherichia coli Also with E. coli GI infection. -- Continue 2 g IV Rocephin daily -- ID following -- Follow cultures  E coli bacteremia Continue IV Rocephin 2 g daily. Infectious disease following. Follow-up blood cultures.  High anion gap metabolic acidosis Due to hypovolemia and diarrhea.  Improving, gap closed today and bicarb improved.  Monitor BMP.  Manage underlying conditions as outlined.  Hypokalemia Potassium critically low at 2.3 this morning.  Oral replacement ordered. -- Monitor BMP, replace K as needed -- Monitor Mg as well  Hyponatremia Present with sodium 128, likely hypovolemic in the setting of diarrhea.  Improved to 132 with IV fluids. --Continue IV fluids -- Monitor BMP  Shigella dysenteriae Management as outlined  Pancolitis ([place]) Due to [place] dysentery. -- Management as outlined  Dysuria Urinalysis with small hemoglobin, negative leukocytes negative nitrite, no bacteria and 0-5 WBCs, amorphous crystals and mucus present.  Suspect small kidney stone/s.  6/30: no reported dysuria  High risk sexual behavior HIV screen was reactive. Negative for gonorrhea and chlamydia. RPR nonreactive. Counseled on admission regarding safe sex practices including use of barrier protection  Leukocytosis Due to Shigella/EIEC infection --Monitor CBC  Insulin dependent type 2 diabetes mellitus (HCC) Home regimen appears to be Levemir 60 to 120 units daily or Semglee 100 units at bedtime (he apparently uses them interchangeably's), metformin. -- Hold metformin --Start with 60 units Semglee at bedtime --Sliding scale NovoLog -- Adjust insulin for goal 140-180  Hyperlipidemia Continue simvastatin        Subjective: Patient awake resting in bed when seen on rounds today.  He reports tolerating food for the first time today.  Still having diarrhea but says its a little bit  less  watery and less frequent today.  Physical Exam: Vitals:   05/04/22 2116 05/05/22 0322 05/05/22 0749 05/05/22 1628  BP: 94/63 113/68 114/66 121/73  Pulse: (!) 109 97 98 94  Resp: '20 20 18 18  '$ Temp: 98.8 F (37.1 C) 98.8 F (37.1 C) 98.9 F (37.2 C) 98.6 F (37 C)  TempSrc:  Oral Oral   SpO2: 97% 99% 99% 99%  Weight:      Height:       General exam: awake, alert, no acute distress HEENT: Clear conjunctiva, anicteric sclera moist mucus membranes, hearing grossly normal  Respiratory system: CTAB, no wheezes, rales or rhonchi, normal respiratory effort. Cardiovascular system: normal S1/S2, RRR, no JVD, murmurs, rubs, gallops, no pedal edema.   Gastrointestinal system: Abdomen soft nontender nondistended with hyperactive bowel sounds Central nervous system: A&O x3. no gross focal neurologic deficits, normal speech Extremities: moves all, no edema, normal tone Skin: dry, intact, normal temperature, normal color, No rashes, lesions or ulcers seen on visualized skin Psychiatry: normal mood, congruent affect, judgement and insight appear normal   Data Reviewed:  Notable labs: Sodium 132, potassium 2.3, bicarb 20, glucose 122, calcium 7.6, procalcitonin 1.04, WBC 12.2, hemoglobin 12.0, reactive HIV screen, chlamydia not detected, gonorrhea not detected, RPR nonreactive  Family Communication: Male at bedside during rounds  Disposition: Status is: Inpatient Remains inpatient appropriate because: Persistent electrolyte derangements requiring IV fluids and replacement with close monitoring.   Planned Discharge Destination: Home    Time spent: 40 minutes  Author: Ezekiel Slocumb, DO 05/05/2022 6:50 PM  For on call review [email or web address].

## 2022-05-06 DIAGNOSIS — A09 Infectious gastroenteritis and colitis, unspecified: Secondary | ICD-10-CM | POA: Diagnosis not present

## 2022-05-06 DIAGNOSIS — B9620 Unspecified Escherichia coli [E. coli] as the cause of diseases classified elsewhere: Secondary | ICD-10-CM | POA: Diagnosis not present

## 2022-05-06 DIAGNOSIS — R7881 Bacteremia: Secondary | ICD-10-CM | POA: Diagnosis not present

## 2022-05-06 DIAGNOSIS — E876 Hypokalemia: Secondary | ICD-10-CM | POA: Diagnosis not present

## 2022-05-06 LAB — GLUCOSE, CAPILLARY
Glucose-Capillary: 239 mg/dL — ABNORMAL HIGH (ref 70–99)
Glucose-Capillary: 216 mg/dL — ABNORMAL HIGH (ref 70–99)
Glucose-Capillary: 233 mg/dL — ABNORMAL HIGH (ref 70–99)
Glucose-Capillary: 152 mg/dL — ABNORMAL HIGH (ref 70–99)
Glucose-Capillary: 240 mg/dL — ABNORMAL HIGH (ref 70–99)

## 2022-05-06 LAB — HEMOGLOBIN A1C
Hgb A1c MFr Bld: 9.1 % — ABNORMAL HIGH (ref 4.8–5.6)
Mean Plasma Glucose: 214 mg/dL

## 2022-05-06 LAB — CBC
HCT: 31.2 % — ABNORMAL LOW (ref 39.0–52.0)
Hemoglobin: 11 g/dL — ABNORMAL LOW (ref 13.0–17.0)
MCH: 29.3 pg (ref 26.0–34.0)
MCHC: 35.3 g/dL (ref 30.0–36.0)
MCV: 83 fL (ref 80.0–100.0)
Platelets: 237 10*3/uL (ref 150–400)
RBC: 3.76 MIL/uL — ABNORMAL LOW (ref 4.22–5.81)
RDW: 13 % (ref 11.5–15.5)
WBC: 7.9 10*3/uL (ref 4.0–10.5)
nRBC: 0 % (ref 0.0–0.2)

## 2022-05-06 LAB — BASIC METABOLIC PANEL
Anion gap: 4 — ABNORMAL LOW (ref 5–15)
BUN: 9 mg/dL (ref 6–20)
CO2: 21 mmol/L — ABNORMAL LOW (ref 22–32)
Calcium: 7.4 mg/dL — ABNORMAL LOW (ref 8.9–10.3)
Chloride: 108 mmol/L (ref 98–111)
Creatinine, Ser: 0.77 mg/dL (ref 0.61–1.24)
GFR, Estimated: >60 mL/min (ref >60)
Glucose, Bld: 244 mg/dL — ABNORMAL HIGH (ref 70–99)
Potassium: 3 mmol/L — ABNORMAL LOW (ref 3.5–5.1)
Sodium: 133 mmol/L — ABNORMAL LOW (ref 135–145)

## 2022-05-06 LAB — PHOSPHORUS: Phosphorus: 2.9 mg/dL (ref 2.5–4.6)

## 2022-05-06 LAB — HIV-1 RNA QUANT-NO REFLEX-BLD
HIV 1 RNA Quant: 712000 copies/mL
LOG10 HIV-1 RNA: 5.852 log10copy/mL

## 2022-05-06 LAB — POTASSIUM: Potassium: 3 mmol/L — ABNORMAL LOW (ref 3.5–5.1)

## 2022-05-06 LAB — MAGNESIUM: Magnesium: 1.8 mg/dL (ref 1.7–2.4)

## 2022-05-06 MED ORDER — POTASSIUM CHLORIDE CRYS ER 20 MEQ PO TBCR
40.0000 meq | EXTENDED_RELEASE_TABLET | Freq: Once | ORAL | Status: AC
  Administered 2022-05-06: 40 meq via ORAL
  Filled 2022-05-06: qty 2

## 2022-05-06 MED ORDER — POTASSIUM CHLORIDE 2 MEQ/ML IV SOLN
INTRAVENOUS | Status: DC
  Filled 2022-05-06 (×4): qty 1000

## 2022-05-06 MED ORDER — INSULIN GLARGINE-YFGN 100 UNIT/ML ~~LOC~~ SOLN
80.0000 [IU] | Freq: Every day | SUBCUTANEOUS | Status: DC
  Administered 2022-05-06 – 2022-05-07 (×2): 80 [IU] via SUBCUTANEOUS
  Filled 2022-05-06 (×2): qty 0.8

## 2022-05-06 MED ORDER — POTASSIUM CHLORIDE 10 MEQ/100ML IV SOLN
10.0000 meq | INTRAVENOUS | Status: AC
  Administered 2022-05-06 (×3): 10 meq via INTRAVENOUS
  Filled 2022-05-06 (×3): qty 100

## 2022-05-06 MED ORDER — INSULIN ASPART 100 UNIT/ML IJ SOLN
5.0000 [IU] | Freq: Three times a day (TID) | INTRAMUSCULAR | Status: DC
  Administered 2022-05-06 – 2022-05-08 (×6): 5 [IU] via SUBCUTANEOUS
  Filled 2022-05-06 (×6): qty 1

## 2022-05-06 MED ORDER — MAGNESIUM SULFATE 2 GM/50ML IV SOLN
2.0000 g | Freq: Once | INTRAVENOUS | Status: AC
Start: 2022-05-06 — End: 2022-05-06
  Administered 2022-05-06: 2 g via INTRAVENOUS
  Filled 2022-05-06: qty 50

## 2022-05-06 NOTE — ED Provider Notes (Signed)
[facility] Provider Note    Event Date/Time   First MD Initiated Contact with Patient 05/04/22 1107     (approximate)   History   GI Bleeding   HPI  Phillip Edwards is a 61 y.o. male who comes in complaining of blood in his stool.  It develops he has been having diarrhea that is now turned bloody.  He has some crampy abdominal pain and diffuse mild abdominal pain on exam.  He has had nausea vomiting diarrhea since Saturday.  He has been tachycardic as well.      Physical Exam   Triage Vital Signs: ED Triage Vitals  Enc Vitals Group     BP 05/04/22 1005 138/80     Pulse Rate 05/04/22 1005 (!) 126     Resp 05/04/22 1005 17     Temp 05/04/22 1005 98.5 F (36.9 C)     Temp Source 05/04/22 1005 Oral     SpO2 05/04/22 1005 97 %     Weight 05/04/22 1005 137 lb (62.1 kg)     Height 05/04/22 1636 '5\' 9"'$  (1.753 m)     Head Circumference --      Peak Flow --      Pain Score 05/04/22 1005 0     Pain Loc --      Pain Edu? --      Excl. in [place]? --     Most recent vital signs: Vitals:   05/06/22 0416 05/06/22 0745  BP: 118/75 (!) 100/56  Pulse: 91 89  Resp: 20 18  Temp: 98.6 F (37 C) 98.7 F (37.1 C)  SpO2: 99% 99%     General: Awake, no distress.  CV:  Good peripheral perfusion.  Heart regular rate and rhythm no audible murmurs somewhat tacky initially Resp:  Normal effort.  Lungs are clear Abd:  No distention.  Soft bowel sounds positive patient has tenderness to palpation on his abdomen. Extremities: No edema   ED Results / Procedures / Treatments   Labs (all labs ordered are listed, but only abnormal results are displayed) Labs Reviewed  GASTROINTESTINAL PANEL BY PCR, STOOL (REPLACES STOOL CULTURE) - Abnormal; Notable for the following components:      Result Value   Shigella/Enteroinvasive E coli (EIEC) DETECTED (*)    All other components within normal limits  CULTURE, BLOOD (ROUTINE X 2) - Abnormal; Notable for the following  components:   Culture ESCHERICHIA COLI (*)    All other components within normal limits  BLOOD CULTURE ID PANEL (REFLEXED) - BCID2 - Abnormal; Notable for the following components:   Enterobacterales DETECTED (*)    Escherichia coli DETECTED (*)    All other components within normal limits  COMPREHENSIVE METABOLIC PANEL - Abnormal; Notable for the following components:   Sodium 128 (*)    Potassium 3.0 (*)    Chloride 96 (*)    CO2 13 (*)    Glucose, Bld 354 (*)    BUN 28 (*)    Creatinine, Ser 1.32 (*)    Calcium 8.2 (*)    Albumin 3.2 (*)    AST 11 (*)    Total Bilirubin 1.9 (*)    Anion gap 19 (*)    All other components within normal limits  CBC WITH DIFFERENTIAL/PLATELET - Abnormal; Notable for the following components:   WBC 14.1 (*)    Neutro Abs 9.2 (*)    Monocytes Absolute 2.3 (*)    Abs Immature Granulocytes 0.96 (*)  All other components within normal limits  HIV ANTIBODY (ROUTINE TESTING W REFLEX) - Abnormal; Notable for the following components:   HIV Screen 4th Generation wRfx Reactive (*)    All other components within normal limits  GLUCOSE, CAPILLARY - Abnormal; Notable for the following components:   Glucose-Capillary 154 (*)    All other components within normal limits  PROTIME-INR - Abnormal; Notable for the following components:   Prothrombin Time 15.3 (*)    All other components within normal limits  BASIC METABOLIC PANEL - Abnormal; Notable for the following components:   Sodium 132 (*)    Potassium 2.3 (*)    CO2 20 (*)    Glucose, Bld 122 (*)    Calcium 7.6 (*)    All other components within normal limits  CBC - Abnormal; Notable for the following components:   WBC 12.2 (*)    RBC 4.13 (*)    Hemoglobin 12.0 (*)    HCT 33.8 (*)    All other components within normal limits  HEMOGLOBIN A1C - Abnormal; Notable for the following components:   Hgb A1c MFr Bld 9.1 (*)    All other components within normal limits  URINALYSIS, COMPLETE (UACMP)  WITH MICROSCOPIC - Abnormal; Notable for the following components:   Color, Urine YELLOW (*)    APPearance HAZY (*)    Glucose, UA 150 (*)    Hgb urine dipstick SMALL (*)    Ketones, ur 20 (*)    Protein, ur 30 (*)    All other components within normal limits  GLUCOSE, CAPILLARY - Abnormal; Notable for the following components:   Glucose-Capillary 134 (*)    All other components within normal limits  GLUCOSE, CAPILLARY - Abnormal; Notable for the following components:   Glucose-Capillary 187 (*)    All other components within normal limits  POTASSIUM - Abnormal; Notable for the following components:   Potassium 2.9 (*)    All other components within normal limits  PHOSPHORUS - Abnormal; Notable for the following components:   Phosphorus 1.1 (*)    All other components within normal limits  GLUCOSE, CAPILLARY - Abnormal; Notable for the following components:   Glucose-Capillary 269 (*)    All other components within normal limits  BASIC METABOLIC PANEL - Abnormal; Notable for the following components:   Sodium 133 (*)    Potassium 3.0 (*)    CO2 21 (*)    Glucose, Bld 244 (*)    Calcium 7.4 (*)    Anion gap 4 (*)    All other components within normal limits  GLUCOSE, CAPILLARY - Abnormal; Notable for the following components:   Glucose-Capillary 270 (*)    All other components within normal limits  GLUCOSE, CAPILLARY - Abnormal; Notable for the following components:   Glucose-Capillary 239 (*)    All other components within normal limits  GLUCOSE, CAPILLARY - Abnormal; Notable for the following components:   Glucose-Capillary 216 (*)    All other components within normal limits  GLUCOSE, CAPILLARY - Abnormal; Notable for the following components:   Glucose-Capillary 233 (*)    All other components within normal limits  POTASSIUM - Abnormal; Notable for the following components:   Potassium 3.0 (*)    All other components within normal limits  C DIFFICILE QUICK SCREEN W  PCR REFLEX    CULTURE, BLOOD (ROUTINE X 2)  CHLAMYDIA/NGC RT PCR ([facility] ONLY)            LACTIC ACID, PLASMA  LACTIC ACID, PLASMA  CORTISOL-AM, BLOOD  PROCALCITONIN  GLUCOSE, CAPILLARY  RPR  HIV-1 RNA QUANT-NO REFLEX-BLD  MAGNESIUM  PHOSPHORUS  HIV-1/2 AB - DIFFERENTIATION  HIV-1/HIV-2 QUAL RNA  MAGNESIUM  CBC  POC OCCULT BLOOD, ED  TYPE AND SCREEN     EKG     RADIOLOGY  CT scan shows pancolitis with possible ileitis.  PROCEDURES:  Critical Care performed:   Procedures   MEDICATIONS ORDERED IN ED: Medications  simvastatin (ZOCOR) tablet 40 mg (40 mg Oral Given 05/06/22 0850)  acetaminophen (TYLENOL) tablet 650 mg (650 mg Oral Given 05/05/22 0948)    Or  acetaminophen (TYLENOL) suppository 650 mg ( Rectal See Alternative 05/05/22 0948)  ondansetron (ZOFRAN) tablet 4 mg ( Oral See Alternative 05/04/22 1647)    Or  ondansetron (ZOFRAN) injection 4 mg (4 mg Intravenous Given 05/04/22 1647)  enoxaparin (LOVENOX) injection 40 mg (40 mg Subcutaneous Given 05/05/22 2304)  senna-docusate (Senokot-S) tablet 1 tablet (has no administration in time range)  insulin aspart (novoLOG) injection 0-15 Units (5 Units Subcutaneous Given 05/06/22 1242)  insulin aspart (novoLOG) injection 0-5 Units (4 Units Subcutaneous Given 05/05/22 2302)  cefTRIAXone (ROCEPHIN) 2 g in sodium chloride 0.9 % 100 mL IVPB (2 g Intravenous New Bag/Given 05/06/22 1242)  insulin glargine-yfgn (SEMGLEE) injection 60 Units (60 Units Subcutaneous Given 05/05/22 2302)  multivitamin with minerals tablet 1 tablet (1 tablet Oral Given 05/06/22 0849)  feeding supplement (GLUCERNA SHAKE) (GLUCERNA SHAKE) liquid 237 mL (237 mLs Oral Given 05/06/22 0851)  insulin aspart (novoLOG) injection 5 Units (5 Units Subcutaneous Given 05/06/22 1243)  potassium chloride SA (KLOR-CON M) CR tablet 40 mEq (has no administration in time range)  lactated ringers bolus 1,000 mL (0 mLs Intravenous Stopped 05/04/22 1441)  iohexol (OMNIPAQUE) 300 MG/ML  solution 100 mL (100 mLs Intravenous Contrast Given 05/04/22 1322)  ciprofloxacin (CIPRO) IVPB 400 mg (0 mg Intravenous Stopped 05/04/22 1551)  sodium chloride 0.9 % bolus 1,000 mL (0 mLs Intravenous Stopped 05/04/22 1628)  lactated ringers bolus 1,000 mL (0 mLs Intravenous Stopped 05/04/22 1627)  potassium chloride SA (KLOR-CON M) CR tablet 40 mEq (40 mEq Oral Given 05/05/22 1701)  potassium chloride 10 mEq in 100 mL IVPB (0 mEq Intravenous Stopping previously hung infusion 05/06/22 0758)  potassium PHOSPHATE 30 mmol in dextrose 5 % 500 mL infusion (0 mmol Intravenous Stopping previously hung infusion 05/06/22 0758)  potassium chloride SA (KLOR-CON M) CR tablet 20 mEq (20 mEq Oral Given 05/05/22 2046)  potassium chloride SA (KLOR-CON M) CR tablet 40 mEq (40 mEq Oral Given 05/06/22 0849)     IMPRESSION / MDM / ASSESSMENT AND PLAN / ED COURSE  I reviewed the triage vital signs and the nursing notes. Patient then has a bloody stool positive for Shigella He has a white count as well.  He is still nauseated.  Procalcitonin is elevated.  CT shows pancolitis because of this and the fact that he is HIV positive has a white count and has been sick for several days with Shigella I will get him in the hospital.  It is possible he is not being able to take his medicines or that they are not being absorbed.  Patient's potassium is certainly low.   Patient's presentation is most consistent with acute presentation with potential threat to life or bodily function.  The patient is on the cardiac monitor to evaluate for evidence of arrhythmia and/or significant heart rate changes.  Clinical Course as of 05/06/22 1518  Thu May 04, 2022  1214 Potassium(!): 3.0 [TP]    Clinical Course User Index [TP] Myra Rude, Student-PA     FINAL CLINICAL IMPRESSION(S) / ED DIAGNOSES   Final diagnoses:  Pancolitis ([place])  Shigella dysentery  Hypokalemia     Rx / DC Orders   ED Discharge Orders     None         Note:  This document was prepared using Dragon voice recognition software and may include unintentional dictation errors.   Nena Polio, MD 05/06/22 [phone]

## 2022-05-06 NOTE — Progress Notes (Signed)
Progress Note   Patient: Phillip Edwards ELF:[id number] DOB: January 25, 1961 DOA: 05/04/2022     1 DOS: the patient was seen and examined on 05/06/2022   Brief hospital course: [name] is a 61 year old male with history of insulin-dependent diabetes mellitus, hyperlipidemia, ED, who presented to the ED on 05/04/2022 for evaluation of of diarrhea several times per day for 5 days in addition of nausea and vomiting.  In the ED, patient was afebrile, tachycardic, otherwise stable vitals.  Labs notable for hyponatremia, hypokalemia, creatinine 1.32, hyperglycemia with nonfasting glucose 354, anion gap metabolic acidosis  CT abdomen pelvis with contrast showed marked pancolitis with potential ileitis favor inflammatory bowel disease versus infectious etiology.  Admitted to the hospital for further evaluation and management.  Stool studies subsequently returned positive for Shigella/EIEC.  Blood cultures subsequently positive for E. coli.  Initially on IV Cipro, changed to 2 g IV Rocephin.  Infectious disease following.   7/1 -- ongoing hypokalemia despite replacement.  Switch to IV replacement this afternoon.   Hopeful to d/c tomorrow.  Assessment and Plan: * Diarrhea GI panel positive for Shigella/EIEC. Treated with IV Cipro in the ED and continued on admission. Antibiotic changed to 2 g IV Rocephin daily. -- Infectious disease following, see their recommendations - Antimotility agents can be ordered once patient has received 24 to 48 hours of IV antibiotic -- Off IV fluids, tolerating diet -- Monitor renal function and electrolytes -- Enteric precautions   Hypophosphatemia Phos 1.1.  Replace with IV K-phos 30 mmol.  Repeat phos level with AM labs.  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 --Confirmatory genotype and viral load pending -- Infectious diseases following  Protein-calorie malnutrition, severe Related to acute illness (pancolitis) as evidenced by  mild to moderate fat depletion, mild to moderate muscle depletion. --Appreciate dietitian recommendations -- Liberalize diet -- Glucerna shakes 3 times daily -- Multivitamin  Bacteremia due to Escherichia coli Also with E. coli GI infection. -- Continue 2 g IV Rocephin daily -- ID following -- Follow cultures  E coli bacteremia Continue IV Rocephin 2 g daily. Infectious disease following. Follow-up blood cultures.  High anion gap metabolic acidosis Due to hypovolemia and diarrhea.  Improving, gap closed today and bicarb improved.  Monitor BMP.  Manage underlying conditions as outlined.  Hypokalemia Potassium critically low at 2.3 this morning.  Oral replacement ordered. -- Monitor BMP, replace K as needed -- Monitor Mg as well  7/1 - AM K was 3.0 given 40 mEq replacement. Repeat K level afternoon remains at 3.0. Suspect poor GI absorption. --Start LR+20 mEq KCl --30 mEq KCl riders  Hyponatremia Present with sodium 128, likely hypovolemic in the setting of diarrhea.  Improved to 132 with IV fluids. --Continue IV fluids -- Monitor BMP  Shigella dysenteriae Management as outlined  Pancolitis ([place]) Due to [place] dysentery. -- Management as outlined  Dysuria Urinalysis with small hemoglobin, negative leukocytes negative nitrite, no bacteria and 0-5 WBCs, amorphous crystals and mucus present.  Suspect small kidney stone/s.  6/30: no reported dysuria  High risk sexual behavior HIV screen was reactive. Negative for gonorrhea and chlamydia. RPR nonreactive. Counseled on admission regarding safe sex practices including use of barrier protection  Leukocytosis Due to Shigella/EIEC infection --Monitor CBC  Insulin dependent type 2 diabetes mellitus (HCC) Home regimen appears to be Levemir 60 to 120 units daily or Semglee 100 units at bedtime (he apparently uses them interchangeably's), metformin. -- Hold metformin --Start with 60 units Semglee at  bedtime --Sliding scale NovoLog -- Adjust insulin for goal 140-180  Hyperlipidemia Continue simvastatin        Subjective: Patient says still have loose stools but not watery, does have some appearance on blood at times.  Eating and drinking much better.  Would like to go home.  Discussed repeating potassium lab this afternoon.  He has no other acute complaints at this time.   Physical Exam: Vitals:   05/06/22 0416 05/06/22 0745 05/06/22 1546 05/06/22 1946  BP: 118/75 (!) 100/56 115/69 115/75  Pulse: 91 89 95 (!) 101  Resp: '20 18 16 18  '$ Temp: 98.6 F (37 C) 98.7 F (37.1 C) 98.6 F (37 C) 99.7 F (37.6 C)  TempSrc: Oral Oral Oral Oral  SpO2: 99% 99% 100% 99%  Weight:      Height:       General exam: awake, alert, no acute distress HEENT: moist mucus membranes, hearing grossly normal  Respiratory system: normal respiratory effort. Cardiovascular system: normal S1/S2, RRR, no JVD, murmurs, rubs, gallops, no pedal edema.   Gastrointestinal system: soft nontender Central nervous system: A&O x3. no gross focal neurologic deficits, normal speech Extremities: moves all, no edema, normal tone Psychiatry: normal mood, congruent affect, judgement and insight appear normal   Data Reviewed:  Notable labs: K 3.0 x 2 today after PO replacement .  Na 133, CO2 21, glucose 244, CBG's elevated in 200's, Ca 7.4, Hbg 11.0  Family Communication: Male at bedside during rounds  Disposition: Status is: Inpatient Remains inpatient appropriate because: Persistent electrolyte derangements requiring IV fluids and replacement with close monitoring.   Planned Discharge Destination: Home    Time spent: 40 minutes  Author: Ezekiel Slocumb, DO 05/06/2022 8:17 PM  For on call review [email or web address].

## 2022-05-06 NOTE — Consult Note (Signed)
PHARMACY CONSULT NOTE - FOLLOW UP  Pharmacy Consult for Electrolyte Monitoring and Replacement   Recent Labs: Potassium (mmol/L)  Date Value  05/06/2022 3.0 (L)   Magnesium (mg/dL)  Date Value  05/06/2022 1.8   Calcium (mg/dL)  Date Value  05/06/2022 7.4 (L)   Albumin (g/dL)  Date Value  05/04/2022 3.2 (L)   Phosphorus (mg/dL)  Date Value  05/06/2022 2.9   Sodium (mmol/L)  Date Value  05/06/2022 133 (L)     Assessment: 61 year old male who presents emergency department for chief concerns of diarrhea several times per day for 5 days. Found to have EIEC gastroenteritis. Pharmacy has been consulted to monitor and replace electrolytes as needed.   Goal of Therapy:  Electrolytes WNL   Plan:   K 3.0   Phos 2.9  Scr 0.77 Mg 1.8 K replaced this AM Will order replace Mg with 2gm IVPB f/u electrolytes with am labs  Phillip Edwards PharmD, BCPS 05/06/2022 4:33 PM

## 2022-05-06 NOTE — Consult Note (Signed)
PHARMACY CONSULT NOTE - FOLLOW UP  Pharmacy Consult for Electrolyte Monitoring and Replacement   Recent Labs: Potassium (mmol/L)  Date Value  05/06/2022 3.0 (L)   Magnesium (mg/dL)  Date Value  05/05/2022 2.2   Calcium (mg/dL)  Date Value  05/06/2022 7.4 (L)   Albumin (g/dL)  Date Value  05/04/2022 3.2 (L)   Phosphorus (mg/dL)  Date Value  05/06/2022 2.9   Sodium (mmol/L)  Date Value  05/06/2022 133 (L)     Assessment: 61 year old male who presents emergency department for chief concerns of diarrhea several times per day for 5 days. Found to have EIEC gastroenteritis. Pharmacy has been consulted to monitor and replace electrolytes as needed.   Goal of Therapy:  Electrolytes WNL   Plan:   K 3.0   Phos 2.9  Scr 0.77 -will order KCL 40 meq po x 1 -recheck K, Mag at 1600 -will f/u electrolytes with am labs    Noralee Space, PharmD Clinical Pharmacist   05/06/2022 7:44 AM

## 2022-05-07 DIAGNOSIS — A09 Infectious gastroenteritis and colitis, unspecified: Secondary | ICD-10-CM | POA: Diagnosis not present

## 2022-05-07 DIAGNOSIS — Z21 Asymptomatic human immunodeficiency virus [HIV] infection status: Secondary | ICD-10-CM | POA: Diagnosis not present

## 2022-05-07 DIAGNOSIS — R7881 Bacteremia: Secondary | ICD-10-CM | POA: Diagnosis not present

## 2022-05-07 DIAGNOSIS — E876 Hypokalemia: Secondary | ICD-10-CM | POA: Diagnosis not present

## 2022-05-07 LAB — BASIC METABOLIC PANEL
Anion gap: 5 (ref 5–15)
BUN: <5 mg/dL — ABNORMAL LOW (ref 6–20)
CO2: 23 mmol/L (ref 22–32)
Calcium: 7.4 mg/dL — ABNORMAL LOW (ref 8.9–10.3)
Chloride: 108 mmol/L (ref 98–111)
Creatinine, Ser: 0.75 mg/dL (ref 0.61–1.24)
GFR, Estimated: >60 mL/min (ref >60)
Glucose, Bld: 139 mg/dL — ABNORMAL HIGH (ref 70–99)
Potassium: 3 mmol/L — ABNORMAL LOW (ref 3.5–5.1)
Sodium: 136 mmol/L (ref 135–145)

## 2022-05-07 LAB — GLUCOSE, CAPILLARY
Glucose-Capillary: 79 mg/dL (ref 70–99)
Glucose-Capillary: 161 mg/dL — ABNORMAL HIGH (ref 70–99)
Glucose-Capillary: 297 mg/dL — ABNORMAL HIGH (ref 70–99)
Glucose-Capillary: 237 mg/dL — ABNORMAL HIGH (ref 70–99)

## 2022-05-07 LAB — CBC
HCT: 30.8 % — ABNORMAL LOW (ref 39.0–52.0)
Hemoglobin: 10.9 g/dL — ABNORMAL LOW (ref 13.0–17.0)
MCH: 29.5 pg (ref 26.0–34.0)
MCHC: 35.4 g/dL (ref 30.0–36.0)
MCV: 83.5 fL (ref 80.0–100.0)
Platelets: 229 10*3/uL (ref 150–400)
RBC: 3.69 MIL/uL — ABNORMAL LOW (ref 4.22–5.81)
RDW: 13.1 % (ref 11.5–15.5)
WBC: 7.4 10*3/uL (ref 4.0–10.5)
nRBC: 0 % (ref 0.0–0.2)

## 2022-05-07 LAB — CULTURE, BLOOD (ROUTINE X 2): Special Requests: ADEQUATE

## 2022-05-07 LAB — PHOSPHORUS: Phosphorus: 2.7 mg/dL (ref 2.5–4.6)

## 2022-05-07 LAB — POTASSIUM
Potassium: 3.3 mmol/L — ABNORMAL LOW (ref 3.5–5.1)
Potassium: 3.2 mmol/L — ABNORMAL LOW (ref 3.5–5.1)

## 2022-05-07 LAB — MAGNESIUM: Magnesium: 1.9 mg/dL (ref 1.7–2.4)

## 2022-05-07 MED ORDER — POTASSIUM CHLORIDE CRYS ER 20 MEQ PO TBCR
20.0000 meq | EXTENDED_RELEASE_TABLET | Freq: Once | ORAL | Status: AC
  Administered 2022-05-07: 20 meq via ORAL
  Filled 2022-05-07: qty 1

## 2022-05-07 MED ORDER — POTASSIUM CHLORIDE 10 MEQ/100ML IV SOLN
10.0000 meq | INTRAVENOUS | Status: AC
  Administered 2022-05-07 (×4): 10 meq via INTRAVENOUS
  Filled 2022-05-07 (×3): qty 100

## 2022-05-07 MED ORDER — POTASSIUM CHLORIDE 10 MEQ/100ML IV SOLN
10.0000 meq | INTRAVENOUS | Status: AC
  Administered 2022-05-07 (×3): 10 meq via INTRAVENOUS
  Filled 2022-05-07 (×2): qty 100

## 2022-05-07 MED ORDER — MAGNESIUM SULFATE IN D5W 1-5 GM/100ML-% IV SOLN
1.0000 g | Freq: Once | INTRAVENOUS | Status: AC
  Administered 2022-05-07: 1 g via INTRAVENOUS
  Filled 2022-05-07: qty 100

## 2022-05-07 MED ORDER — BICTEGRAVIR-EMTRICITAB-TENOFOV 50-200-25 MG PO TABS
1.0000 | ORAL_TABLET | Freq: Every day | ORAL | Status: DC
  Administered 2022-05-07 – 2022-05-08 (×2): 1 via ORAL
  Filled 2022-05-07 (×2): qty 1

## 2022-05-07 NOTE — Consult Note (Addendum)
PHARMACY CONSULT NOTE - FOLLOW UP  Pharmacy Consult for Electrolyte Monitoring and Replacement   Recent Labs: Potassium (mmol/L)  Date Value  05/07/2022 3.2 (L)   Magnesium (mg/dL)  Date Value  05/07/2022 1.9   Calcium (mg/dL)  Date Value  05/07/2022 7.4 (L)   Albumin (g/dL)  Date Value  05/04/2022 3.2 (L)   Phosphorus (mg/dL)  Date Value  05/07/2022 2.7   Sodium (mmol/L)  Date Value  05/07/2022 136     Assessment: 61 year old male who presents emergency department for chief concerns of diarrhea several times per day for 5 days. Found to have EIEC gastroenteritis. Pharmacy has been consulted to monitor and replace electrolytes as needed.   Goal of Therapy:  Electrolytes WNL   Plan:  K 3.2 on 7/2 @ 1558. Patient received 11mq of potassium IV.  Will order Kcl 234m PO x 1 dose this evening. (Noted patient on LR with 2057mKCL @ 35m51m) Follow up morning lab results and replace electrolytes as needed.  Levon Boettcher Rodriguez-Guzman PharmD, BCPS 05/07/2022 5:10 PM

## 2022-05-07 NOTE — Consult Note (Signed)
PHARMACY CONSULT NOTE - FOLLOW UP  Pharmacy Consult for Electrolyte Monitoring and Replacement   Recent Labs: Potassium (mmol/L)  Date Value  05/07/2022 3.0 (L)   Magnesium (mg/dL)  Date Value  05/07/2022 1.9   Calcium (mg/dL)  Date Value  05/07/2022 7.4 (L)   Albumin (g/dL)  Date Value  05/04/2022 3.2 (L)   Phosphorus (mg/dL)  Date Value  05/07/2022 2.7   Sodium (mmol/L)  Date Value  05/07/2022 136     Assessment: 61 year old male who presents emergency department for chief concerns of diarrhea several times per day for 5 days. Found to have EIEC gastroenteritis. Pharmacy has been consulted to monitor and replace electrolytes as needed.   Goal of Therapy:  Electrolytes WNL   Plan:   K 3.0   Phos 2.7  Scr 0.75  Mg 1.9 Currently on LR w/ KCL 20 meq @ 75 ml/hr MD ordered KCL 10 meq IV x 4 doses this am 7/2 Will order Mg with 1gm IVPB Recheck K '@1400'$  f/u electrolytes with am labs  Chinita Greenland PharmD Clinical Pharmacist 05/07/2022

## 2022-05-07 NOTE — Progress Notes (Signed)
Progress Note   Patient: Phillip Edwards ZOX:[id number] DOB: 12-17-1960 DOA: 05/04/2022     2 DOS: the patient was seen and examined on 05/07/2022   Brief hospital course: Mr. Manus Weedman is a 61 year old male with history of insulin-dependent diabetes mellitus, hyperlipidemia, ED, who presented to the ED on 05/04/2022 for evaluation of of diarrhea several times per day for 5 days in addition of nausea and vomiting.  In the ED, patient was afebrile, tachycardic, otherwise stable vitals.  Labs notable for hyponatremia, hypokalemia, creatinine 1.32, hyperglycemia with nonfasting glucose 354, anion gap metabolic acidosis  CT abdomen pelvis with contrast showed marked pancolitis with potential ileitis favor inflammatory bowel disease versus infectious etiology.  Admitted to the hospital for further evaluation and management.  Stool studies subsequently returned positive for Shigella/EIEC.  Blood cultures subsequently positive for E. coli.  Initially on IV Cipro, changed to 2 g IV Rocephin.  Infectious disease following.   7/1 -- ongoing hypokalemia despite replacement.  Switch to IV replacement this afternoon.   Hopeful to d/c tomorrow.  Assessment and Plan: * Diarrhea GI panel positive for Shigella/EIEC. Treated with IV Cipro in the ED and continued on admission. Antibiotic changed to 2 g IV Rocephin daily. 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 Confirmed with HIV-1 RNA quant/viral load of 712,000 --CD4 count, genosure and acute hepatitis panel pending --Start Biktarvy -- Infectious diseases following  Bacteremia due to Escherichia coli Also with E. coli GI infection. -- Continue 2 g IV Rocephin daily -- ID  following -- Follow cultures  E coli bacteremia Continue IV Rocephin 2 g daily. Infectious disease following. Follow-up blood cultures.  Hypokalemia Potassium critically low at 2.3 this morning.  Oral replacement ordered. -- Monitor BMP, replace K as needed -- Monitor Mg as well  7/1 - AM K was 3.0 given 40 mEq replacement. Repeat K level afternoon remains at 3.0. Suspect poor GI absorption. 7/2 -a.m. K was again 3.0 this morning despite IV replacement yesterday and overnight --Continue LR+20 mEq KCl --KCl riders x 4 -- Repeat potassium level this afternoon  Hypophosphatemia Phos 1.1.  Replace with IV K-phos 30 mmol.  Repeat phos level with AM labs.  Hyponatremia Present with sodium 128, likely hypovolemic in the setting of diarrhea.  Improved to 132 with IV fluids. --Continue IV fluids -- Monitor BMP  Protein-calorie malnutrition, severe Related to acute illness (pancolitis) as evidenced by mild to moderate fat depletion, mild to moderate muscle depletion. --Appreciate dietitian recommendations -- Liberalize diet -- Glucerna shakes 3 times daily -- Multivitamin  High anion gap metabolic acidosis Due to hypovolemia and diarrhea.  Improving, gap closed today and bicarb improved.  Monitor BMP.  Manage underlying conditions as outlined.  Shigella dysenteriae Management as outlined  Pancolitis ([place]) Due to [place] dysentery. -- Management as outlined  Dysuria Urinalysis with small hemoglobin, negative leukocytes negative nitrite, no bacteria and 0-5 WBCs, amorphous crystals and mucus present.  Suspect small kidney stone/s.  6/30: no reported dysuria  High risk sexual behavior HIV+ likely acute infection as patient reportedly tested negative a month ago. Negative for gonorrhea and chlamydia. RPR nonreactive. Counseled on admission regarding safe sex practices including use of barrier protection  Leukocytosis Due to Shigella/EIEC infection --Monitor  CBC  Insulin dependent type 2 diabetes mellitus (HCC) Home regimen appears to be Levemir 60 to 120 units daily or Semglee 100 units at bedtime (he apparently uses them interchangeably's), metformin. -- Hold metformin --Start with 60 units Semglee at bedtime --Sliding scale NovoLog -- Adjust insulin for goal 140-180  Hyperlipidemia Continue simvastatin        Subjective: Patient was awake resting in bed when seen on rounds today.  No visitors present at the time.  I informed him of his HIV positive status.  He says he tested negative about 1 month ago.  Says stools are better still loose and fairly frequent.  Says he has bowel movement each time he urinates.  Hopes to go home soon, would like to go to later today.  He is agreeable to starting antiviral therapy for HIV.  Physical Exam: Vitals:   05/06/22 1546 05/06/22 1946 05/07/22 0505 05/07/22 0816  BP: 115/69 115/75 121/77 131/77  Pulse: 95 (!) 101 (!) 107 (!) 106  Resp: '16 18 17 18  '$ Temp: 98.6 F (37 C) 99.7 F (37.6 C) 97.6 F (36.4 C) 99.7 F (37.6 C)  TempSrc: Oral Oral  Oral  SpO2: 100% 99% 98% 99%  Weight:      Height:       General exam: awake, alert, no acute distress HEENT: moist mucus membranes, hearing grossly normal  Respiratory system: Normal respiratory effort, on room air Cardiovascular system: normal S1/S2, RRR, no pedal edema.   Gastrointestinal system: Soft nontender nondistended Central nervous system: A&O x3. no gross focal neurologic deficits, normal speech Extremities: moves all, no edema, normal tone Psychiatry: normal mood, congruent affect, judgement and insight appear normal   Data Reviewed:  Notable labs: Potassium again 3.0 unchanged, glucose 139, BUN less than 5, calcium 7.4.  Hemoglobin 10.9.  A.m. CBG 79, lunchtime CBG 161  Family Communication: Male at bedside during rounds  Disposition: Status is: Inpatient Remains inpatient appropriate because: Persistent electrolyte  derangements requiring IV fluids and replacement with close monitoring.   Planned Discharge Destination: Home    Time spent: 40 minutes  Author: Ezekiel Slocumb, DO 05/07/2022 2:18 PM  For on call review [email or web address].

## 2022-05-07 NOTE — Consult Note (Signed)
PHARMACY CONSULT NOTE - FOLLOW UP  Pharmacy Consult for Electrolyte Monitoring and Replacement   Recent Labs: Potassium (mmol/L)  Date Value  05/07/2022 3.3 (L)   Magnesium (mg/dL)  Date Value  05/07/2022 1.9   Calcium (mg/dL)  Date Value  05/07/2022 7.4 (L)   Albumin (g/dL)  Date Value  05/04/2022 3.2 (L)   Phosphorus (mg/dL)  Date Value  05/07/2022 2.7   Sodium (mmol/L)  Date Value  05/07/2022 136     Assessment: 61 year old male who presents emergency department for chief concerns of diarrhea several times per day for 5 days. Found to have EIEC gastroenteritis. Pharmacy has been consulted to monitor and replace electrolytes as needed.   Goal of Therapy:  Electrolytes WNL   Plan:   K 3.0   Phos 2.7  Scr 0.75  Mg 1.9 Currently on LR w/ KCL 20 meq @ 75 ml/hr MD ordered KCL 10 meq IV x 4 doses this am 7/2 Will order Mg with 1gm IVPB Recheck K '@1600'$  f/u electrolytes with am labs  7/2 '@1019'$  K= 3.3  (pt had only received 1 bag of KCL at this time- will f/u KCL @ [place] PharmD Clinical Pharmacist 05/07/2022

## 2022-05-08 ENCOUNTER — Other Ambulatory Visit: Payer: Self-pay | Admitting: Infectious Diseases

## 2022-05-08 ENCOUNTER — Encounter: Payer: Self-pay | Admitting: Internal Medicine

## 2022-05-08 DIAGNOSIS — Z21 Asymptomatic human immunodeficiency virus [HIV] infection status: Secondary | ICD-10-CM | POA: Diagnosis not present

## 2022-05-08 DIAGNOSIS — R7881 Bacteremia: Secondary | ICD-10-CM | POA: Diagnosis not present

## 2022-05-08 DIAGNOSIS — A09 Infectious gastroenteritis and colitis, unspecified: Secondary | ICD-10-CM | POA: Diagnosis not present

## 2022-05-08 DIAGNOSIS — E876 Hypokalemia: Secondary | ICD-10-CM | POA: Diagnosis not present

## 2022-05-08 DIAGNOSIS — B20 Human immunodeficiency virus [HIV] disease: Secondary | ICD-10-CM

## 2022-05-08 LAB — BASIC METABOLIC PANEL
Anion gap: 8 (ref 5–15)
BUN: 6 mg/dL (ref 6–20)
CO2: 26 mmol/L (ref 22–32)
Calcium: 8.2 mg/dL — ABNORMAL LOW (ref 8.9–10.3)
Chloride: 105 mmol/L (ref 98–111)
Creatinine, Ser: 0.82 mg/dL (ref 0.61–1.24)
GFR, Estimated: >60 mL/min (ref >60)
Glucose, Bld: 181 mg/dL — ABNORMAL HIGH (ref 70–99)
Potassium: 3.9 mmol/L (ref 3.5–5.1)
Sodium: 139 mmol/L (ref 135–145)

## 2022-05-08 LAB — HEPATITIS PANEL, ACUTE
HCV Ab: NONREACTIVE
Hep A IgM: NONREACTIVE
Hep B C IgM: NONREACTIVE
Hepatitis B Surface Ag: NONREACTIVE

## 2022-05-08 LAB — GLUCOSE, CAPILLARY: Glucose-Capillary: 148 mg/dL — ABNORMAL HIGH (ref 70–99)

## 2022-05-08 LAB — MAGNESIUM: Magnesium: 2.2 mg/dL (ref 1.7–2.4)

## 2022-05-08 LAB — PHOSPHORUS: Phosphorus: 4.2 mg/dL (ref 2.5–4.6)

## 2022-05-08 MED ORDER — LAMIVUDINE 150 MG PO TABS: 300.0000 mg | ORAL_TABLET | Freq: Every day | ORAL | Status: DC

## 2022-05-08 MED ORDER — ADULT MULTIVITAMIN W/MINERALS CH: 1.0000 | ORAL_TABLET | Freq: Every day | ORAL | Status: DC

## 2022-05-08 MED ORDER — BIKTARVY 50-200-25 MG PO TABS
1.0000 | ORAL_TABLET | Freq: Every day | ORAL | 1 refills | Status: DC
Start: 2022-05-08 — End: 2022-07-05

## 2022-05-08 MED ORDER — DOLUTEGRAVIR-LAMIVUDINE 50-300 MG PO TABS
1.0000 | ORAL_TABLET | Freq: Every day | ORAL | Status: DC
Start: 2022-05-08 — End: 2022-05-08

## 2022-05-08 MED ORDER — DOLUTEGRAVIR SODIUM 50 MG PO TABS
50.0000 mg | ORAL_TABLET | Freq: Every day | ORAL | Status: DC
Start: 2022-05-09 — End: 2022-05-08

## 2022-05-08 MED ORDER — BICTEGRAVIR-EMTRICITAB-TENOFOV 50-200-25 MG PO TABS: 1.0000 | ORAL_TABLET | Freq: Every day | ORAL | Status: DC

## 2022-05-08 MED ORDER — POTASSIUM CHLORIDE CRYS ER 20 MEQ PO TBCR: 20.0000 meq | EXTENDED_RELEASE_TABLET | Freq: Two times a day (BID) | ORAL | 0 refills | Status: DC

## 2022-05-08 MED ORDER — DOLUTEGRAVIR-LAMIVUDINE 50-300 MG PO TABS
1.0000 | ORAL_TABLET | Freq: Every day | ORAL | 1 refills | Status: DC
Start: 2022-05-08 — End: 2022-05-08

## 2022-05-08 MED ORDER — DOLUTEGRAVIR-LAMIVUDINE 50-300 MG PO TABS: 1.0000 | ORAL_TABLET | Freq: Every day | ORAL | Status: DC

## 2022-05-08 MED ORDER — CIPROFLOXACIN HCL 500 MG PO TABS: 500.0000 mg | ORAL_TABLET | Freq: Two times a day (BID) | ORAL | 0 refills | Status: AC

## 2022-05-08 NOTE — Discharge Summary (Signed)
Physician Discharge Summary   Patient: Phillip Edwards MRN: [id number] DOB: 03/08/1961  Admit date:     05/04/2022  Discharge date: 05/08/22  Discharge Physician: Ezekiel Slocumb   PCP: Juluis Pitch, MD   Recommendations at discharge:   Follow up with Primary care in 1 week Repeat CMP, Mg, CBC in 1 week Follow up with Infectious Disease in 2 weeks Follow up pending HIV Genosure, CD4 count   Discharge Diagnoses: Principal Problem:   Diarrhea Active Problems:   E coli bacteremia   Bacteremia due to Escherichia coli   HIV positive (HCC)   Hyperlipidemia   Insulin dependent type 2 diabetes mellitus ([place])   High risk sexual behavior   Pancolitis (HCC)   Shigella dysenteriae   High anion gap metabolic acidosis   Protein-calorie malnutrition, severe  Resolved Problems:   Hypokalemia   Hyponatremia   Hypophosphatemia   Leukocytosis   Dysuria  Hospital Course: Phillip Edwards is a 61 year old male with history of insulin-dependent diabetes mellitus, hyperlipidemia, ED, who presented to the ED on 05/04/2022 for evaluation of of diarrhea several times per day for 5 days in addition of nausea and vomiting.  In the ED, patient was afebrile, tachycardic, otherwise stable vitals.  Labs notable for hyponatremia, hypokalemia, creatinine 1.32, hyperglycemia with nonfasting glucose 354, anion gap metabolic acidosis  CT abdomen pelvis with contrast showed marked pancolitis with potential ileitis favor inflammatory bowel disease versus infectious etiology.  Admitted to the hospital for further evaluation and management.  Stool studies subsequently returned positive for Shigella/EIEC.  Blood cultures subsequently positive for E. coli.  Initially on IV Cipro, changed to 2 g IV Rocephin.  Infectious disease following.   7/1 -- ongoing hypokalemia despite replacement.  Switch to IV replacement this afternoon.   Hopeful to d/c tomorrow.  7/3 - potassium stabilized and normal today  after further IV replacement past two days.  Pt feels better, still having multiple loose, not watery BM's daily. Eating and drinking well.  Clinically improved and stable for discharge today.     Assessment and Plan: * Diarrhea GI panel positive for Shigella/EIEC. Treated with IV Cipro in the ED and continued on admission.  Antibiotic changed to 2 g IV Rocephin daily. -- Infectious disease consulted -- Off IV fluids, tolerating diet --Potassium finally improved 3.9 today --Discharge with Potassium supplement x 1 week until repeat labs  -- Repeat BMP, Mg, CBC in 1-2 weeks   HIV positive ([place]) HIV screen on admission was reactive. 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  Acute hepatitis panel was negative. --Started on Biktarvy --CD4 count, genosure are still pending -- Infectious disease follow-up in 2 weeks --Pt instructed to inform any sexual partners and to avoid sex until viral load undetectable, always use barrier protection when resuming sex.  Bacteremia due to Escherichia coli Also with E. coli GI infection. -- Treated with 2 g IV Rocephin daily -- Discharge on PO Cipro x 6 days to complete course -- ID consulted   E coli bacteremia Treated IV Rocephin 2 g daily. Discharge on PO Cipro x 6 days Infectious disease consulted.   Hypokalemia-resolved as of 05/08/2022 Potassium critically low at 2.3 this morning.  Oral replacement ordered.  7/1 - AM K was 3.0 given 40 mEq replacement. Repeat K level afternoon remains at 3.0. Suspect poor GI absorption. 7/2 -a.m. K was again 3.0 this morning despite IV replacement yesterday and overnight   --Replaced K with fluid additive, LR+20 mEq KCl, plus  KCl riders.  Was not absorbing PO replacement.  -- Potassium level this AM is normal at 3.9 -- D/C with Potassium supplement x 1 week until repeat labs since still having multiple BM's ddaily -- Monitor BMP, replace K as needed -- Monitor Mg as  well   Hypophosphatemia-resolved as of 05/08/2022 Phos 1.1.  Replaced with IV K-phos 30 mmol.  Resolved.  Hyponatremia-resolved as of 05/08/2022 Present with sodium 128, likely hypovolemic in the setting of diarrhea.  Resolved with IV fluids. --Stop IV fluids -- Monitor BMP  Protein-calorie malnutrition, severe Related to acute illness (pancolitis) as evidenced by mild to moderate fat depletion, mild to moderate muscle depletion. --Appreciate dietitian recommendations -- Liberalize diet -- Glucerna shakes 3 times daily -- Multivitamin  High anion gap metabolic acidosis Due to hypovolemia and diarrhea.  Improving, gap closed today and bicarb improved.  Manage underlying conditions as outlined.  Resolved.  Shigella dysenteriae Management as outlined  Pancolitis ([place]) Due to [place] dysentery. -- Management as outlined  High risk sexual behavior HIV+ likely acute infection as patient reportedly tested negative a month ago. Negative for gonorrhea and chlamydia. RPR nonreactive. Counseled on admission regarding safe sex practices including use of barrier protection.   Will be followed outpatient by ID.  Insulin dependent type 2 diabetes mellitus (HCC) Home regimen appears to be Levemir 60 to 120 units daily or Semglee 100 units at bedtime (he apparently uses them interchangeably's), metformin. -- Held metformin, resume at d/c --Resume home insulin regimen at d/c.   Hyperlipidemia Continue simvastatin  Dysuria-resolved as of 05/08/2022 Urinalysis with small hemoglobin, negative leukocytes negative nitrite, no bacteria and 0-5 WBCs, amorphous crystals and mucus present.  Suspect small kidney stone/s.  6/30--7/3: no reports of dysuria  Leukocytosis-resolved as of 05/08/2022 Due to Shigella/EIEC infection --Monitor CBC         Consultants: Infectious Disease Procedures performed: none  Disposition: Home Diet recommendation:  Carb modified diet DISCHARGE  MEDICATION: Allergies as of 05/08/2022   No Known Allergies      Medication List     TAKE these medications    Biktarvy 50-200-25 MG Tabs tablet Generic drug: bictegravir-emtricitabine-tenofovir AF Take 1 tablet by mouth daily.   Bismuth Subsalicylate 242 PN/36RW Susp Take 15 mLs by mouth 4 (four) times daily as needed.   Cetirizine HCl 10 MG Caps Take 10 mg by mouth daily.   ciprofloxacin 500 MG tablet Commonly known as: Cipro Take 1 tablet (500 mg total) by mouth 2 (two) times daily for 6 days.   fenofibrate 160 MG tablet Take 160 mg by mouth daily.   insulin glargine 100 UNIT/ML Solostar Pen Commonly known as: LANTUS Inject 50-100 Units into the skin. Daily as directed   Levemir 100 UNIT/ML injection Generic drug: insulin detemir Inject 60-120 Units into the skin in the morning.   metFORMIN 1000 MG tablet Commonly known as: GLUCOPHAGE Take 1,000 mg by mouth 2 (two) times daily with a meal.   Onglyza 5 MG Tabs tablet Generic drug: saxagliptin HCl Take by mouth daily.   potassium chloride SA 20 MEQ tablet Commonly known as: KLOR-CON M Take 1 tablet (20 mEq total) by mouth 2 (two) times daily for 7 days.   sildenafil 100 MG tablet Commonly known as: VIAGRA Take by mouth.   simvastatin 40 MG tablet Commonly known as: ZOCOR Take 40 mg by mouth daily.        Discharge Exam: Filed Weights   05/04/22 1005 05/04/22 1636  Weight: 62.1 kg 62.1  kg   General exam: awake, alert, no acute distress, underweight HEENT: atraumatic, clear conjunctiva, anicteric sclera, moist mucus membranes, hearing grossly normal  Respiratory system: normal respiratory effort. Cardiovascular system: RRR, no pedal edema.   Gastrointestinal system: soft, NT, ND Central nervous system: A&O x4. no gross focal neurologic deficits, normal speech Extremities: moves all, no edema, normal tone Skin: dry, intact, normal temperature Psychiatry: normal mood, congruent affect, judgement and  insight appear normal   Condition at discharge: stable  The results of significant diagnostics from this hospitalization (including imaging, microbiology, ancillary and laboratory) are listed below for reference.   Imaging Studies: CT ABDOMEN PELVIS W CONTRAST  Result Date: 05/04/2022 CLINICAL DATA:  A 61 year old male presents for evaluation of abdominal pain. EXAM: CT ABDOMEN AND PELVIS WITH CONTRAST TECHNIQUE: Multidetector CT imaging of the abdomen and pelvis was performed using the standard protocol following bolus administration of intravenous contrast. RADIATION DOSE REDUCTION: This exam was performed according to the departmental dose-optimization program which includes automated exposure control, adjustment of the mA and/or kV according to patient size and/or use of iterative reconstruction technique. CONTRAST:  151m OMNIPAQUE IOHEXOL 300 MG/ML  SOLN COMPARISON:  None available FINDINGS: Lower chest: Unremarkable Hepatobiliary: Mild fissural widening of hepatic fissures. Suggestion of mild steatosis. Post cholecystectomy. No signs of biliary duct distension. No focal, suspicious hepatic lesion. The portal vein is patent. Pancreas: Normal, without mass, inflammation or ductal dilatation. Spleen: Top normal splenic size, 11-12 cm. Adrenals/Urinary Tract: Adrenal glands are normal. Renal cyst on the LEFT arises from posterior hilar lip measuring 2.5 cm. Compatible with benign finding for which no additional dedicated imaging follow-up is recommended. No hydronephrosis. Mild perinephric stranding. No perivesical stranding. Stomach/Bowel: Small hiatal hernia. No signs of small bowel dilation. Mild small bowel mesenteric edema. Irregularity of the terminal ileum. Mural stratification of the terminal ileum just at the ileocecal valve. Diffuse mural stratification of the colon. Colonic wall thickening with the "comb sign" about the colon. Pericolonic stranding. No pneumatosis. Differential enhancement at  the rectosigmoid transition where there is lower attenuation of the wall of the rectum than within the colon and still with hyperenhancement of the rectal mucosa. The appendix while mildly thickened is without stranding beyond what is seen elsewhere, compatible with secondary inflammation rather than appendicitis. Vascular/Lymphatic: Patent abdominal vessels on venous phase. Specifically the SMV is patent. No adenopathy but with scattered lymph nodes throughout the retroperitoneum which are favored to be reactive. Reproductive: Calcifications of the vas deferens. Otherwise unremarkable appearance of the reproductive structures. Other: No ascites.  No pneumoperitoneum. Musculoskeletal: No acute or destructive bone process. IMPRESSION: 1. Marked pancolitis with potential ileitis favor inflammatory bowel disease. Would also correlate with any risk factors for infectious colitis such as C difficile. GI consultation is suggested. 2. Variable enhancement is noted with respect to the rectum but still with mucosal enhancement on the current study. Would correlate with lactate to exclude early ischemia given hypoenhancement of the rectal wall, though this may reflect differential perfusion at the time of scan related to differing vascular distribution. Close follow-up is suggested with trending of lactate. 3. Findings of potential liver disease with fissural widening and mild hepatic steatosis with borderline splenomegaly should be correlated with any clinical or laboratory evidence of liver disease. Electronically Signed   By: GZetta BillsM.D.   On: 05/04/2022 13:43    Microbiology: Results for orders placed or performed during the hospital encounter of 05/04/22  C Difficile Quick Screen w PCR reflex  Status: None   Collection Time: 05/04/22 12:20 PM   Specimen: STOOL  Result Value Ref Range Status   C Diff antigen NEGATIVE NEGATIVE Final   C Diff toxin NEGATIVE NEGATIVE Final   C Diff interpretation No C.  difficile detected.  Final    Comment: Performed at [facility], [address] [place], [place] [address]  Gastrointestinal Panel by PCR , Stool     Status: Abnormal   Collection Time: 05/04/22 12:20 PM   Specimen: Stool  Result Value Ref Range Status   Campylobacter species NOT DETECTED NOT DETECTED Final   Plesimonas shigelloides NOT DETECTED NOT DETECTED Final   Salmonella species NOT DETECTED NOT DETECTED Final   Yersinia enterocolitica NOT DETECTED NOT DETECTED Final   Vibrio species NOT DETECTED NOT DETECTED Final   Vibrio cholerae NOT DETECTED NOT DETECTED Final   Enteroaggregative E coli (EAEC) NOT DETECTED NOT DETECTED Final   Enteropathogenic E coli (EPEC) NOT DETECTED NOT DETECTED Final   Enterotoxigenic E coli (ETEC) NOT DETECTED NOT DETECTED Final   Shiga like toxin producing E coli (STEC) NOT DETECTED NOT DETECTED Final   Shigella/Enteroinvasive E coli (EIEC) DETECTED (A) NOT DETECTED Corrected    Comment: RESULT CALLED TO, READ BACK BY AND VERIFIED WITH: LINDSAY BLACK ON 05/04/22 AT 1425 DE CORRECTED ON 06/30 AT 0433: PREVIOUSLY REPORTED AS DETECTED LINDSAY BLACK 05/04/22 1425 DE    Cryptosporidium NOT DETECTED NOT DETECTED Final   Cyclospora cayetanensis NOT DETECTED NOT DETECTED Final   Entamoeba histolytica NOT DETECTED NOT DETECTED Final   Giardia lamblia NOT DETECTED NOT DETECTED Final   Adenovirus F40/41 NOT DETECTED NOT DETECTED Final   Astrovirus NOT DETECTED NOT DETECTED Final   Norovirus GI/GII NOT DETECTED NOT DETECTED Final   Rotavirus A NOT DETECTED NOT DETECTED Final   Sapovirus (I, II, IV, and V) NOT DETECTED NOT DETECTED Final    Comment: Performed at [facility], [address] [place], [place] [address]  Culture, blood (routine x 2)     Status: None (Preliminary result)   Collection Time: 05/04/22  2:41 PM   Specimen: BLOOD  Result Value Ref Range Status   Specimen Description BLOOD LEFT HAND  Final   Special Requests    Final    BOTTLES DRAWN AEROBIC AND ANAEROBIC Blood Culture results may not be optimal due to an inadequate volume of blood received in culture bottles   Culture   Final    NO GROWTH 4 DAYS Performed at [facility], [address], [place], [place] [address]    Report Status PENDING  Incomplete  Culture, blood (routine x 2)     Status: Abnormal   Collection Time: 05/04/22  2:41 PM   Specimen: BLOOD  Result Value Ref Range Status   Specimen Description   Final    BLOOD LEFT AC Performed at [facility], [address] [place], [place] [address]    Special Requests   Final    BOTTLES DRAWN AEROBIC AND ANAEROBIC Blood Culture adequate volume Performed at [facility], [address] [place], [place] [address]    Culture  Setup Time   Final    Organism ID to follow GRAM NEGATIVE RODS IN BOTH AEROBIC AND ANAEROBIC BOTTLES CRITICAL RESULT CALLED TO, READ BACK BY AND VERIFIED WITH: NATHAN BELUTH '@0500'$  ON 05/05/2022.Marland KitchenMarland KitchenTKR Performed at [facility], [address] [place],  [address]    Culture ESCHERICHIA COLI (A)  Final   Report Status  05/07/2022 FINAL  Final   Organism ID, Bacteria ESCHERICHIA COLI  Final      Susceptibility   Escherichia coli - MIC*    AMPICILLIN >=32 RESISTANT Resistant     CEFAZOLIN <=4 SENSITIVE Sensitive     CEFEPIME <=0.12 SENSITIVE Sensitive     CEFTAZIDIME <=1 SENSITIVE Sensitive     CEFTRIAXONE <=0.25 SENSITIVE Sensitive     CIPROFLOXACIN <=0.25 SENSITIVE Sensitive     GENTAMICIN <=1 SENSITIVE Sensitive     IMIPENEM <=0.25 SENSITIVE Sensitive     TRIMETH/SULFA <=20 SENSITIVE Sensitive     AMPICILLIN/SULBACTAM >=32 RESISTANT Resistant     PIP/TAZO <=4 SENSITIVE Sensitive     * ESCHERICHIA COLI  Blood Culture ID Panel (Reflexed)     Status: Abnormal   Collection Time: 05/04/22  2:41 PM  Result Value Ref Range Status   Enterococcus faecalis NOT DETECTED NOT DETECTED Final   Enterococcus Faecium NOT  DETECTED NOT DETECTED Final   Listeria monocytogenes NOT DETECTED NOT DETECTED Final   Staphylococcus species NOT DETECTED NOT DETECTED Final   Staphylococcus aureus (BCID) NOT DETECTED NOT DETECTED Final   Staphylococcus epidermidis NOT DETECTED NOT DETECTED Final   Staphylococcus lugdunensis NOT DETECTED NOT DETECTED Final   Streptococcus species NOT DETECTED NOT DETECTED Final   Streptococcus agalactiae NOT DETECTED NOT DETECTED Final   Streptococcus pneumoniae NOT DETECTED NOT DETECTED Final   Streptococcus pyogenes NOT DETECTED NOT DETECTED Final   A.calcoaceticus-baumannii NOT DETECTED NOT DETECTED Final   Bacteroides fragilis NOT DETECTED NOT DETECTED Final   Enterobacterales DETECTED (A) NOT DETECTED Final    Comment: Enterobacterales represent a large order of gram negative bacteria, not a single organism. CRITICAL RESULT CALLED TO, READ BACK BY AND VERIFIED WITH: NATHAN BELUTH '@0500'$  ON 05/05/2022.Marland KitchenMarland KitchenTKR    Enterobacter cloacae complex NOT DETECTED NOT DETECTED Final   Escherichia coli DETECTED (A) NOT DETECTED Final    Comment: CRITICAL RESULT CALLED TO, READ BACK BY AND VERIFIED WITH: [place] '@0500'$  ON 05/05/2022.Marland KitchenMarland KitchenTKR    Klebsiella aerogenes NOT DETECTED NOT DETECTED Final   Klebsiella oxytoca NOT DETECTED NOT DETECTED Final   Klebsiella pneumoniae NOT DETECTED NOT DETECTED Final   Proteus species NOT DETECTED NOT DETECTED Final   Salmonella species NOT DETECTED NOT DETECTED Final   Serratia marcescens NOT DETECTED NOT DETECTED Final   Haemophilus influenzae NOT DETECTED NOT DETECTED Final   Neisseria meningitidis NOT DETECTED NOT DETECTED Final   Pseudomonas aeruginosa NOT DETECTED NOT DETECTED Final   Stenotrophomonas maltophilia NOT DETECTED NOT DETECTED Final   Candida albicans NOT DETECTED NOT DETECTED Final   Candida auris NOT DETECTED NOT DETECTED Final   Candida glabrata NOT DETECTED NOT DETECTED Final   Candida krusei NOT DETECTED NOT DETECTED Final    Candida parapsilosis NOT DETECTED NOT DETECTED Final   Candida tropicalis NOT DETECTED NOT DETECTED Final   Cryptococcus neoformans/gattii NOT DETECTED NOT DETECTED Final   CTX-M ESBL NOT DETECTED NOT DETECTED Final   Carbapenem resistance IMP NOT DETECTED NOT DETECTED Final   Carbapenem resistance KPC NOT DETECTED NOT DETECTED Final   Carbapenem resistance NDM NOT DETECTED NOT DETECTED Final   Carbapenem resist OXA 48 LIKE NOT DETECTED NOT DETECTED Final   Carbapenem resistance VIM NOT DETECTED NOT DETECTED Final    Comment: Performed at [facility], [address] [place], [place] [address]  [place] rt PCR [facility] only)     Status: None   Collection Time: 05/05/22  3:18 AM   Specimen: Urine  Result  Value Ref Range Status   Specimen source GC/Chlam URINE, RANDOM  Final   Chlamydia Tr NOT DETECTED NOT DETECTED Final   N gonorrhoeae NOT DETECTED NOT DETECTED Final    Comment: (NOTE) This CT/NG assay has not been evaluated in patients with a history of  hysterectomy. Performed at [facility], [address] [place], [place] [address]     Labs: CBC: Recent Labs  Lab 05/04/22 1036 05/05/22 0540 05/06/22 1539 05/07/22 0357  WBC 14.1* 12.2* 7.9 7.4  NEUTROABS 9.2*  --   --   --   HGB 15.2 12.0* 11.0* 10.9*  HCT 43.4 33.8* 31.2* 30.8*  MCV 84.6 81.8 83.0 83.5  PLT 268 234 237 341   Basic Metabolic Panel: Recent Labs  Lab 05/04/22 1036 05/05/22 0540 05/05/22 1739 05/06/22 0412 05/06/22 1321 05/06/22 1539 05/07/22 0357 05/07/22 1019 05/07/22 1558 05/08/22 0449  NA 128* 132*  --  133*  --   --  136  --   --  139  K 3.0* 2.3* 2.9* 3.0* 3.0*  --  3.0* 3.3* 3.2* 3.9  CL 96* 104  --  108  --   --  108  --   --  105  CO2 13* 20*  --  21*  --   --  23  --   --  26  GLUCOSE 354* 122*  --  244*  --   --  139*  --   --  181*  BUN 28* 15  --  9  --   --  <5*  --   --  6  CREATININE 1.32* 0.74  --  0.77  --   --  0.75  --   --  0.82  CALCIUM 8.2*  7.6*  --  7.4*  --   --  7.4*  --   --  8.2*  MG  --   --  2.2  --   --  1.8 1.9  --   --  2.2  PHOS  --   --  1.1* 2.9  --   --  2.7  --   --  4.2   Liver Function Tests: Recent Labs  Lab 05/04/22 1036  AST 11*  ALT 14  ALKPHOS 55  BILITOT 1.9*  PROT 7.6  ALBUMIN 3.2*   CBG: Recent Labs  Lab 05/07/22 0840 05/07/22 1236 05/07/22 1725 05/07/22 2115 05/08/22 0722  GLUCAP 79 161* 297* 237* 148*    Discharge time spent: greater than 30 minutes.  Signed: Ezekiel Slocumb, DO [facility] 05/08/2022

## 2022-05-08 NOTE — TOC Initial Note (Signed)
Transition of Care [facility]) - Initial/Assessment Note    Patient Details  Name: Phillip Edwards MRN: [id number] Date of Birth: [birth year]  Transition of Care [facility]) CM/SW Contact:    Beverly Sessions, RN Phone Number: 05/08/2022, 10:59 AM  Clinical Narrative:                   Transition of Care [facility]) Screening Note   Patient Details  Name: Phillip Edwards Date of Birth: 06-Mar-1961   Transition of Care [facility]) CM/SW Contact:    Beverly Sessions, RN Phone Number: 05/08/2022, 10:59 AM    Transition of Care Department [facility]) has reviewed patient and no TOC needs have been identified at this time. We will continue to monitor patient advancement through interdisciplinary progression rounds. If new patient transition needs arise, please place a TOC consult.         Patient Goals and CMS Choice        Expected Discharge Plan and Services           Expected Discharge Date: 05/08/22                                    Prior Living Arrangements/Services                       Activities of Daily Living Home Assistive Devices/Equipment: None ADL Screening (condition at time of admission) Patient's cognitive ability adequate to safely complete daily activities?: Yes Is the patient deaf or have difficulty hearing?: No Does the patient have difficulty seeing, even when wearing glasses/contacts?: No Does the patient have difficulty concentrating, remembering, or making decisions?: No Patient able to express need for assistance with ADLs?: Yes Does the patient have difficulty dressing or bathing?: No Independently performs ADLs?: Yes (appropriate for developmental age) Does the patient have difficulty walking or climbing stairs?: No Weakness of Legs: None Weakness of Arms/Hands: None  Permission Sought/Granted                  Emotional Assessment              Admission diagnosis:  Diarrhea [R19.7] Bacteremia due to Escherichia coli [R78.81,  B96.20] Patient Active Problem List   Diagnosis Date Noted   High anion gap metabolic acidosis 40/98/1191   E coli bacteremia 05/05/2022   Bacteremia due to Escherichia coli 05/05/2022   Protein-calorie malnutrition, severe 05/05/2022   HIV positive ([place]) 05/05/2022   Diarrhea 05/04/2022   Insulin dependent type 2 diabetes mellitus ([place]) 05/04/2022   High risk sexual behavior 05/04/2022   Pancolitis ([place]) 05/04/2022   Shigella dysenteriae 05/04/2022   Hyperlipidemia 03/05/2015   Type 2 diabetes mellitus without complication ([place]) 47/82/9562   PCP:  Juluis Pitch, MD Pharmacy:   [facility], [place] [address] [place] [address] Phone: [phone] Fax: [phone]  [id number] [place] [place] - [address] [id number]: [phone]: [phone]    Social Determinants of Health (SDOH) Interventions    Readmission Risk Interventions     No data to display

## 2022-05-08 NOTE — Progress Notes (Signed)
Date of Admission:  05/04/2022     ID: Phillip Edwards is a 61 y.o. male Principal Problem:   Diarrhea Active Problems:   Hyperlipidemia   Insulin dependent type 2 diabetes mellitus (HCC)   Leukocytosis   High risk sexual behavior   Dysuria   Pancolitis (HCC)   Shigella dysenteriae   Hyponatremia   Hypokalemia   High anion gap metabolic acidosis   E coli bacteremia   Bacteremia due to Escherichia coli   Protein-calorie malnutrition, severe   HIV positive (HCC)   Hypophosphatemia    Subjective: Diarrhea resolved He is diagnosed with acute HIV Started on Biktarvy and tolerating well  Medications:   [START ON 05/09/2022] bictegravir-emtricitabine-tenofovir AF  1 tablet Oral Daily   enoxaparin (LOVENOX) injection  40 mg Subcutaneous QHS   feeding supplement (GLUCERNA SHAKE)  237 mL Oral TID BM   insulin aspart  0-15 Units Subcutaneous TID WC   insulin aspart  0-5 Units Subcutaneous QHS   insulin aspart  5 Units Subcutaneous TID WC   insulin glargine-yfgn  80 Units Subcutaneous QHS   multivitamin with minerals  1 tablet Oral Daily   simvastatin  40 mg Oral Daily    Objective: Vital signs in last 24 hours: Temp:  [98.5 F (36.9 C)-99 F (37.2 C)] 98.5 F (36.9 C) (07/03 0549) Pulse Rate:  [85-101] 85 (07/03 0549) Resp:  [16-18] 18 (07/03 0549) BP: (114-128)/(68-75) 128/68 (07/03 0549) SpO2:  [99 %-100 %] 100 % (07/03 0549)    PHYSICAL EXAM:  General: Alert, cooperative, no distress, appears stated age.  Head: Normocephalic, without obvious abnormality, atraumatic. Eyes: Conjunctivae clear, anicteric sclerae. Pupils are equal ENT Nares normal. No drainage or sinus tenderness. Lips, mucosa, and tongue normal. No Thrush Neck: Supple, symmetrical, no adenopathy, thyroid: non tender no carotid bruit and no JVD. Back: No CVA tenderness. Lungs: Clear to auscultation bilaterally. No Wheezing or Rhonchi. No rales. Heart: Regular rate and rhythm, no murmur, rub or  gallop. Abdomen: Soft, non-tender,not distended. Bowel sounds normal. No masses Extremities: atraumatic, no cyanosis. No edema. No clubbing Skin: No rashes or lesions. Or bruising Lymph: Cervical, supraclavicular normal. Neurologic: Grossly non-focal  Lab Results Recent Labs    05/06/22 1539 05/07/22 0357 05/07/22 1019 05/07/22 1558 05/08/22 0449  WBC 7.9 7.4  --   --   --   HGB 11.0* 10.9*  --   --   --   HCT 31.2* 30.8*  --   --   --   NA  --  136  --   --  139  K  --  3.0*   < > 3.2* 3.9  CL  --  108  --   --  105  CO2  --  23  --   --  26  BUN  --  <5*  --   --  6  CREATININE  --  0.75  --   --  0.82   < > = values in this interval not displayed.   Microbiology: E.coli bacteremia Studies/Results: No results found.   Assessment/Plan: Acute gastroenteritis and e.coli bacteremia- on ceftriaxone- on discharge will be switched to PO quinolone for 6 more days- 05/14/22  Acute HIV- ( had a negative test in May 2023) Vl 712K, genotype sent Started on Biktarvy Will not Do dovato ( dolutegravir _ 3 tc) currently because resistance test is not back and also high VL Discussed the side effects with patient Discussed , pathogenesis of Hiv and  improtance of adherence  He says his partner is HIV positive and on HAART and he found out only now Asked him to avoid unprotected sex, rather practice abstinence until he becomes undetectable RPR/GC/CHL neg He has an appt with me on 05/18/22 and will do further tests .  Discussed the management with [facility] and the pharmacist and his nurse

## 2022-05-09 LAB — CULTURE, BLOOD (ROUTINE X 2): Culture: NO GROWTH

## 2022-05-09 LAB — HIV-1/2 AB - DIFFERENTIATION
HIV 1 Ab: REACTIVE
HIV 2 Ab: NONREACTIVE

## 2022-05-10 LAB — IMMATURE CELLS
Bands(Auto) Relative: 2 %
MYELOCYTES: 1 % — ABNORMAL HIGH (ref 0–0)
Metamyelocytes: 14 % — ABNORMAL HIGH (ref 0–0)

## 2022-05-10 LAB — HELPER T-LYMPH-CD4 (ARMC ONLY)
% CD 4 Pos. Lymph.: 20.2 % — ABNORMAL LOW (ref 30.8–58.5)
Absolute CD 4 Helper: 162 /uL — ABNORMAL LOW (ref 359–1519)
Basophils Absolute: 0.1 10*3/uL (ref 0.0–0.2)
Basos: 2 %
EOS (ABSOLUTE): 0 10*3/uL (ref 0.0–0.4)
Eos: 0 %
Hematocrit: 37.5 % (ref 37.5–51.0)
Hemoglobin: 12.3 g/dL — ABNORMAL LOW (ref 13.0–17.7)
Lymphocytes Absolute: 0.8 10*3/uL (ref 0.7–3.1)
Lymphs: 12 %
MCH: 28.9 pg (ref 26.6–33.0)
MCHC: 32.8 g/dL (ref 31.5–35.7)
MCV: 88 fL (ref 79–97)
Monocytes Absolute: 0.8 10*3/uL (ref 0.1–0.9)
Monocytes: 12 %
Neutrophils Absolute: 3.9 10*3/uL (ref 1.4–7.0)
Neutrophils: 57 %
Platelets: 254 10*3/uL (ref 150–450)
RBC: 4.25 x10E6/uL (ref 4.14–5.80)
RDW: 13.2 % (ref 11.6–15.4)
WBC: 6.6 10*3/uL (ref 3.4–10.8)

## 2022-05-12 LAB — HIV-1/HIV-2 QUAL RNA
HIV-1 RNA, Qualitative: REACTIVE — AB
HIV-2 RNA, Qualitative: NONREACTIVE

## 2022-05-18 ENCOUNTER — Ambulatory Visit: Payer: BC Managed Care – PPO | Attending: Infectious Diseases | Admitting: Infectious Diseases

## 2022-05-18 ENCOUNTER — Other Ambulatory Visit (HOSPITAL_COMMUNITY)
Admission: RE | Admit: 2022-05-18 | Discharge: 2022-05-18 | Disposition: A | Discharge status: Home / Self Care | Payer: BC Managed Care – PPO | Source: Ambulatory Visit | Attending: Infectious Diseases | Admitting: Infectious Diseases

## 2022-05-18 ENCOUNTER — Other Ambulatory Visit
Admission: RE | Admit: 2022-05-18 | Discharge: 2022-05-18 | Disposition: A | Discharge status: Home / Self Care | Payer: BC Managed Care – PPO | Source: Ambulatory Visit | Attending: Infectious Diseases | Admitting: Infectious Diseases

## 2022-05-18 ENCOUNTER — Encounter: Payer: Self-pay | Admitting: Infectious Diseases

## 2022-05-18 VITALS — BP 111/74 | HR 114 | Temp 97.6°F | Ht 69.0 in | Wt 149.0 lb

## 2022-05-18 DIAGNOSIS — Z79899 Other long term (current) drug therapy: Secondary | ICD-10-CM | POA: Diagnosis not present

## 2022-05-18 DIAGNOSIS — B20 Human immunodeficiency virus [HIV] disease: Secondary | ICD-10-CM | POA: Diagnosis present

## 2022-05-18 DIAGNOSIS — Z794 Long term (current) use of insulin: Secondary | ICD-10-CM | POA: Diagnosis not present

## 2022-05-18 DIAGNOSIS — E781 Pure hyperglyceridemia: Secondary | ICD-10-CM | POA: Insufficient documentation

## 2022-05-18 DIAGNOSIS — Z23 Encounter for immunization: Secondary | ICD-10-CM

## 2022-05-18 DIAGNOSIS — Z7984 Long term (current) use of oral hypoglycemic drugs: Secondary | ICD-10-CM | POA: Diagnosis not present

## 2022-05-18 DIAGNOSIS — Z8719 Personal history of other diseases of the digestive system: Secondary | ICD-10-CM | POA: Insufficient documentation

## 2022-05-18 DIAGNOSIS — E119 Type 2 diabetes mellitus without complications: Secondary | ICD-10-CM | POA: Diagnosis not present

## 2022-05-18 LAB — CHLAMYDIA/NGC RT PCR (ARMC ONLY)
Chlamydia Tr: NOT DETECTED
Chlamydia Tr: NOT DETECTED
N gonorrhoeae: NOT DETECTED
N gonorrhoeae: NOT DETECTED

## 2022-05-18 NOTE — Addendum Note (Signed)
Addended by: Daisy Floro T on: 05/18/2022 04:13 PM   Modules accepted: Orders

## 2022-05-18 NOTE — Patient Instructions (Signed)
You are here for HIV care . you have been started on biktarvy- you will get labs in 6 weeks- Hiv RNA and cd4, quantoieron gold. Continue biktarvy

## 2022-05-18 NOTE — Progress Notes (Signed)
NAME: Phillip Edwards  DOB: May 14, 1961  MRN: [id number]  Date/Time: 05/18/2022 9:07 AM  Subjective:   ?Here to engage in HIV care  Phillip Edwards is a 61 y.o. male with a history  DM, was recently in the hospital with acute diarrhea for 4 days and was diagnosed with   HIV on 05/04/22 Started on [place] while in the hospital Vl 712K Cd4 is 49 Says he is tolerating biktarvy, no nausea, no pain abdomen, no diarrhea No fever  Has 8 pound weight loss ( lost 13 pounds and gained 5 pounds since discharge)  Acquired thru sex with Men He is bisexual Was married for 3 26yr and wife passed on in a MVA in 2April 02, 2018Now has a male partner  Genotype- pending ? Past Medical History:  Diagnosis Date   [place] mellitus without complication ([place]    Dysuria 05/04/2022   Hyperlipidemia    Hypokalemia 05/05/2022   Hyponatremia 05/05/2022   Hypophosphatemia 05/05/2022   Leukocytosis 05/04/2022   Pancreatitis  Past Surgical History:  Procedure Laterality Date   COLONOSCOPY WITH PROPOFOL N/A 05/25/2017   Procedure: COLONOSCOPY WITH PROPOFOL;  Surgeon: SLollie Sails MD;  [facility];  Service: Endoscopy;  Laterality: N/A;   Cholecystectomy- lap Social History   Socioeconomic History   Marital status: Widowed    Spouse name: Not on file   Number of children: Not on file   Years of education: Not on file   Highest education level: Not on file  Occupational History   Not on file  Tobacco Use   Smoking status: Never   Smokeless tobacco: Never  Vaping Use   Vaping Use: Never used  Substance and Sexual Activity   Alcohol use: Yes    Alcohol/week: 4.0 standard drinks of alcohol    Types: 2 Cans of beer, 2 Standard drinks or equivalent per week   Drug use: No   Sexual activity: Yes    Partners: Male, Male    Birth control/protection: None  Other Topics Concern   Not on file  Social History Narrative   Not on file   Social Determinants of Health   Financial Resource  Strain: Not on file  Food Insecurity: Not on file  Transportation Needs: Not on file  Physical Activity: Not on file  Stress: Not on file  Social Connections: Not on file  Intimate Partner Violence: Not At Risk (12/28/2020)   Humiliation, Afraid, Rape, and Kick questionnaire    Fear of Current or Ex-Partner: No    Emotionally Abused: No    Physically Abused: No    Sexually Abused: No   Works at [employer] in mother and father  No Known Allergies ? Current Outpatient Medications  Medication Sig Dispense Refill   bictegravir-emtricitabine-tenofovir AF (BIKTARVY) 50-200-25 MG TABS tablet Take 1 tablet by mouth daily. 30 tablet 1   Bismuth Subsalicylate 5834MHD/62IWSUSP Take 15 mLs by mouth 4 (four) times daily as needed.     Cetirizine HCl 10 MG CAPS Take 10 mg by mouth daily.     fenofibrate 160 MG tablet Take 160 mg by mouth daily.     metFORMIN (GLUCOPHAGE) 1000 MG tablet Take 1,000 mg by mouth 2 (two) times daily with a meal.     ondansetron (ZOFRAN-ODT) 4 MG disintegrating tablet Take 4 mg by mouth every 8 (eight) hours as needed.     saxagliptin HCl (ONGLYZA) 5 MG TABS tablet Take by mouth daily.  SEMGLEE, YFGN, 100 UNIT/ML injection SMARTSIG:100-120 Unit(s) SUB-Q Daily     sildenafil (VIAGRA) 100 MG tablet Take by mouth.     simvastatin (ZOCOR) 40 MG tablet Take 40 mg by mouth daily.     potassium chloride SA (KLOR-CON M) 20 MEQ tablet Take 1 tablet (20 mEq total) by mouth 2 (two) times daily for 7 days. 14 tablet 0   No current facility-administered medications for this visit.    REVIEW OF SYSTEMS:  Const: negative fever, negative chills, negative weight loss Eyes: negative diplopia or visual changes, negative eye pain ENT: negative coryza, negative sore throat Resp: negative cough, hemoptysis, dyspnea Cards: negative for chest pain, palpitations, lower extremity edema GU: negative for frequency, dysuria and hematuria Skin: negative for rash and pruritus Heme:  negative for easy bruising and gum/nose bleeding MS: negative for myalgias, arthralgias, back pain and muscle weakness Neurolo:negative for headaches, dizziness, vertigo, memory problems  Psych: negative for feelings of anxiety, depression   Objective:  VITALS:  BP 111/74   Pulse (!) 114   Temp 97.6 F (36.4 C) (Temporal)   Ht '5\' 9"'$  (1.753 m)   Wt 149 lb (67.6 kg)   BMI 22.00 kg/m  PHYSICAL EXAM:  General: Alert, cooperative, no distress, appears stated age.  Head: Normocephalic, without obvious abnormality, atraumatic. Eyes: Conjunctivae clear, anicteric sclerae. Pupils are equal Nose: Nares normal. No drainage or sinus tenderness. Throat: Lips, mucosa, and tongue normal. No Thrush Neck: Supple, symmetrical, no adenopathy, thyroid: non tender no carotid bruit and no JVD. Back: No CVA tenderness. Lungs: Clear to auscultation bilaterally. No Wheezing or Rhonchi. No rales. Heart: Regular rate and rhythm, no murmur, rub or gallop. Abdomen: Soft, non-tender,not distended. Bowel sounds normal. No masses Extremities: Extremities normal, atraumatic, no cyanosis. No edema. No clubbing Skin: No rashes or lesions. Not Jaundiced Lymph: Cervical, supraclavicular normal. Neurologic: Grossly non-focal Pertinent Labs  Health maintenance Vaccination  Vaccine Date last given comment  Influenza    Hepatitis B    Hepatitis A    Pneumococcal 20 05/18/22       TdaP    HPV    Shingrix ( zoster vaccine)     ______________________  Labs Lab Result  Date comment  HIV VL 712 K 05/04/22   CD4 142    Genotype     [id number]     HIV antibody     RPR NR 05/05/22   Quantiferon Gold     Hep C ab     Hepatitis B-ab,ag,c     Hepatitis A-IgM, IgG /T     Lipid 371/1427/28.7    GC/CHL     PAP     HB,PLT,Cr, LFT       Preventive  Procedure Result  Date comment  colonoscopy          Dental exam     Opthal       Impression/Recommendation ? ?AIDS newly diagnosed - VL was 712K on 05/04/22  and cd4 was 142 On biktarvy  Will get labs in 6 weeks  DM on metformin, insulin  Hyperlipidemia and hypertriglyceridemia On statin Says not taking fenofibrate May benefit from checking Lipo ( a) PCP Dr.Bronstein  Anal pap/ GC /CHL rectum, throat done Bisexual Both male and male partners know his staus Male partner tested negative Male partner is positive and on HAART  H/o pancreatitis   Discussed Hiv pathogenesis, progression, to practice safe sex and compliance with visit and adherence to HAART Total time spent 50 min

## 2022-05-23 LAB — CYTOLOGY - PAP

## 2022-07-05 ENCOUNTER — Other Ambulatory Visit: Payer: Self-pay

## 2022-07-05 MED ORDER — BIKTARVY 50-200-25 MG PO TABS: 1.0000 | ORAL_TABLET | Freq: Every day | ORAL | 1 refills | Status: DC

## 2022-07-13 ENCOUNTER — Encounter: Payer: Self-pay | Admitting: Infectious Diseases

## 2022-07-13 ENCOUNTER — Ambulatory Visit: Payer: BC Managed Care – PPO | Attending: Infectious Diseases | Admitting: Infectious Diseases

## 2022-07-13 ENCOUNTER — Other Ambulatory Visit
Admission: RE | Admit: 2022-07-13 | Discharge: 2022-07-13 | Disposition: A | Discharge status: Home / Self Care | Payer: BC Managed Care – PPO | Source: Ambulatory Visit | Attending: Infectious Diseases | Admitting: Infectious Diseases

## 2022-07-13 VITALS — BP 151/84 | HR 99 | Temp 98.2°F | Ht 69.0 in | Wt 155.0 lb

## 2022-07-13 DIAGNOSIS — Z8719 Personal history of other diseases of the digestive system: Secondary | ICD-10-CM | POA: Diagnosis not present

## 2022-07-13 DIAGNOSIS — E119 Type 2 diabetes mellitus without complications: Secondary | ICD-10-CM | POA: Diagnosis not present

## 2022-07-13 DIAGNOSIS — Z21 Asymptomatic human immunodeficiency virus [HIV] infection status: Secondary | ICD-10-CM | POA: Insufficient documentation

## 2022-07-13 DIAGNOSIS — Z23 Encounter for immunization: Secondary | ICD-10-CM | POA: Diagnosis not present

## 2022-07-13 DIAGNOSIS — E781 Pure hyperglyceridemia: Secondary | ICD-10-CM | POA: Diagnosis not present

## 2022-07-13 DIAGNOSIS — B20 Human immunodeficiency virus [HIV] disease: Secondary | ICD-10-CM | POA: Diagnosis present

## 2022-07-13 DIAGNOSIS — E785 Hyperlipidemia, unspecified: Secondary | ICD-10-CM | POA: Diagnosis not present

## 2022-07-13 NOTE — Patient Instructions (Signed)
You are here for follow up-= continue bitarvy- will do HIV RNA today will give you hepB shot and TdaP today Follow up 3 months

## 2022-07-13 NOTE — Progress Notes (Signed)
NAME: Phillip Edwards  DOB: [birth year]  MRN: [id number]  Date/Time: 07/13/2022 9:09 AM  Subjective:   ?follow up visit Last seen on 05/18/22. He then saw his PCP on 05/22/22 and had labs drawn VL was 490 ( decined from 700K) and cd4 440 ( up from 210) He is doing very well. Has gained nearly all the lost weight No diarrhea  Phillip Edwards is a 61 y.o. male with a history  DM, was recently in the hospital with acute diarrhea for 4 days and was diagnosed with   HIV on 05/04/22 Started on [place] while in the hospital Vl 712K on diagnosis Cd4 is 59  Acquired thru sex with Men He is bisexual Was married for  47yr and wife passed on in a MVA in 231-Mar-2018Now has a male partner - she knows his status. She is on PreP  ? Past Medical History:  Diagnosis Date   [place] ([place]    Dysuria 05/04/2022   Hyperlipidemia    Hypokalemia 05/05/2022   Hyponatremia 05/05/2022   Hypophosphatemia 05/05/2022   Leukocytosis 05/04/2022   Pancreatitis  Past Surgical History:  Procedure Laterality Date   COLONOSCOPY WITH PROPOFOL N/A 05/25/2017   Procedure: COLONOSCOPY WITH PROPOFOL;  Surgeon: SLollie Sails MD;  [facility];  Service: Endoscopy;  Laterality: N/A;   Cholecystectomy- lap Social History   Socioeconomic History   Marital status: Widowed    Spouse name: Not on file   Number of children: Not on file   Years of education: Not on file   Highest education level: Not on file  Occupational History   Not on file  Tobacco Use   Smoking status: Never   Smokeless tobacco: Never  Vaping Use   Vaping Use: Never used  Substance and Sexual Activity   Alcohol use: Yes    Alcohol/week: 4.0 standard drinks of alcohol    Types: 2 Cans of beer, 2 Standard drinks or equivalent per week   Drug use: No   Sexual activity: Yes    Partners: Male, Male    Birth control/protection: None  Other Topics Concern   Not on file  Social History Narrative    Not on file   Social Determinants of Health   Financial Resource Strain: Not on file  Food Insecurity: Not on file  Transportation Needs: Not on file  Physical Activity: Not on file  Stress: Not on file  Social Connections: Not on file  Intimate Partner Violence: Not At Risk (12/28/2020)   Humiliation, Afraid, Rape, and Kick questionnaire    Fear of Current or Ex-Partner: No    Emotionally Abused: No    Physically Abused: No    Sexually Abused: No   Works at [employer] in mother and father  No Known Allergies ? Current Outpatient Medications  Medication Sig Dispense Refill   bictegravir-emtricitabine-tenofovir AF (BIKTARVY) 50-200-25 MG TABS tablet Take 1 tablet by mouth daily. 30 tablet 1   Cetirizine HCl 10 MG CAPS Take 10 mg by mouth daily.     fenofibrate 160 MG tablet Take 160 mg by mouth daily.     metFORMIN (GLUCOPHAGE) 1000 MG tablet Take 1,000 mg by mouth 2 (two) times daily with a meal.     ondansetron (ZOFRAN-ODT) 4 MG disintegrating tablet Take 4 mg by mouth every 8 (eight) hours as needed.     saxagliptin HCl (ONGLYZA) 5 MG TABS tablet Take by mouth daily.     SEMGLEE,  YFGN, 100 UNIT/ML injection SMARTSIG:100-120 Unit(s) SUB-Q Daily     sildenafil (VIAGRA) 100 MG tablet Take by mouth.     simvastatin (ZOCOR) 40 MG tablet Take 40 mg by mouth daily.     potassium chloride SA (KLOR-CON M) 20 MEQ tablet Take 1 tablet (20 mEq total) by mouth 2 (two) times daily for 7 days. 14 tablet 0   No current facility-administered medications for this visit.    REVIEW OF SYSTEMS:  Const: negative fever, negative chills, negative weight loss Eyes: negative diplopia or visual changes, negative eye pain ENT: negative coryza, negative sore throat Resp: negative cough, hemoptysis, dyspnea Cards: negative for chest pain, palpitations, lower extremity edema GU: negative for frequency, dysuria and hematuria Skin: negative for rash and pruritus Heme: negative for easy bruising  and gum/nose bleeding MS: negative for myalgias, arthralgias, back pain and muscle weakness Neurolo:negative for headaches, dizziness, vertigo, memory problems  Psych: negative for feelings of anxiety, depression   Objective:  VITALS:  BP (!) 151/84   Pulse 99   Temp 98.2 F (36.8 C) (Temporal)   Ht '5\' 9"'$  (1.753 m)   Wt 155 lb (70.3 kg)   BMI 22.89 kg/m  PHYSICAL EXAM:  General: Alert, cooperative, no distress, appears stated age.  Head: Normocephalic, without obvious abnormality, atraumatic. Eyes: Conjunctivae clear, anicteric sclerae. Pupils are equal Nose: Nares normal. No drainage or sinus tenderness. Throat: Lips, mucosa, and tongue normal. No Thrush Neck: Supple, symmetrical, no adenopathy, thyroid: non tender no carotid bruit and no JVD. Back: No CVA tenderness. Lungs: Clear to auscultation bilaterally. No Wheezing or Rhonchi. No rales. Heart: Regular rate and rhythm, no murmur, rub or gallop. Abdomen: Soft, non-tender,not distended. Bowel sounds normal. No masses Extremities: Extremities normal, atraumatic, no cyanosis. No edema. No clubbing Skin: No rashes or lesions. Not Jaundiced Lymph: Cervical, supraclavicular normal. Neurologic: Grossly non-focal Pertinent Labs  Health maintenance Vaccination  Vaccine Date last given comment  Influenza    Hepatitis B 07/13/22 ( 3rd dose)   Hepatitis A    Pneumococcal 20 05/18/22       TdaP 07/13/22   HPV    Shingrix ( zoster vaccine)     ______________________  Labs Lab Result  Date comment  HIV VL 490 7/17   CD4 440 7/17   Genotype     [id number]     HIV antibody     RPR NR 05/05/22   Quantiferon Gold     Hep C ab     Hepatitis B-ab,ag,c     Hepatitis A-IgM, IgG /T     Lipid 371/1427/28.7    GC/CHL     PAP     HB,PLT,Cr, LFT       Preventive  Procedure Result  Date comment  colonoscopy   Scheduled for Dec       Dental exam     Opthal       Impression/Recommendation ? ?AIDS newly diagnosed - VL was  712K on 05/04/22 and cd4 was 142, no w it is 490 VL and 440 cd4 from 05/22/22 On biktarvy  100% adherent [place] will do VL  DM on metformin, insulin, glipizide PRN  Hyperlipidemia and hypertriglyceridemia On statin   Anal pap/ GC /CHL rectum, throat done negative Both male and male partners know his staus Male partner tested negative Male partner is positive and on HAART  H/o pancreatitis Colonoscopy schdeuled for Dec 2023  Reinforced adherence to meds, safe sex and vaccination  Follow up 3 months

## 2022-07-14 LAB — HIV-1 RNA QUANT-NO REFLEX-BLD
HIV 1 RNA Quant: 40 copies/mL
LOG10 HIV-1 RNA: 1.602 log10copy/mL

## 2022-07-14 LAB — HEPATITIS B SURFACE ANTIBODY, QUANTITATIVE: Hep B S AB Quant (Post): >1000 m[IU]/mL (ref >9.9)

## 2022-07-17 ENCOUNTER — Telehealth: Payer: Self-pay

## 2022-07-17 LAB — QUANTIFERON-TB GOLD PLUS (RQFGPL)
QuantiFERON Mitogen Value: >10 IU/mL
QuantiFERON Nil Value: 0.87 IU/mL
QuantiFERON TB1 Ag Value: 0.86 IU/mL
QuantiFERON TB2 Ag Value: 0.99 IU/mL

## 2022-07-17 LAB — QUANTIFERON-TB GOLD PLUS: QuantiFERON-TB Gold Plus: NEGATIVE

## 2022-07-17 NOTE — Telephone Encounter (Signed)
Tsosie Billing, MD  P [facility] Triage Nurse Pool Please let the patient know VL is 40 and near undetectable( <20). Thx        Previous Messages    ----- Message -----  From: Buel Ream, Lab In [place]  Sent: 07/14/2022   7:38 AM EDT  To: Tsosie Billing, MD

## 2022-09-04 ENCOUNTER — Other Ambulatory Visit: Payer: Self-pay

## 2022-09-05 MED ORDER — BIKTARVY 50-200-25 MG PO TABS: 1.0000 | ORAL_TABLET | Freq: Every day | ORAL | 3 refills | Status: DC

## 2022-09-05 NOTE — Addendum Note (Signed)
Addended by: Daisy Floro T on: 09/05/2022 04:37 PM   Modules accepted: Orders

## 2022-10-12 ENCOUNTER — Encounter: Payer: Self-pay | Admitting: Infectious Diseases

## 2022-10-12 ENCOUNTER — Ambulatory Visit: Payer: BC Managed Care – PPO | Attending: Infectious Diseases | Admitting: Infectious Diseases

## 2022-10-12 ENCOUNTER — Other Ambulatory Visit
Admission: RE | Admit: 2022-10-12 | Discharge: 2022-10-12 | Disposition: A | Discharge status: Home / Self Care | Payer: BC Managed Care – PPO | Source: Ambulatory Visit | Attending: Infectious Diseases | Admitting: Infectious Diseases

## 2022-10-12 VITALS — BP 138/76 | HR 97 | Temp 98.2°F | Ht 69.0 in | Wt 166.0 lb

## 2022-10-12 DIAGNOSIS — E119 Type 2 diabetes mellitus without complications: Secondary | ICD-10-CM | POA: Diagnosis not present

## 2022-10-12 DIAGNOSIS — Z79624 Long term (current) use of inhibitors of nucleotide synthesis: Secondary | ICD-10-CM | POA: Insufficient documentation

## 2022-10-12 DIAGNOSIS — E781 Pure hyperglyceridemia: Secondary | ICD-10-CM | POA: Diagnosis not present

## 2022-10-12 DIAGNOSIS — Z7984 Long term (current) use of oral hypoglycemic drugs: Secondary | ICD-10-CM | POA: Insufficient documentation

## 2022-10-12 DIAGNOSIS — Z79899 Other long term (current) drug therapy: Secondary | ICD-10-CM | POA: Diagnosis not present

## 2022-10-12 DIAGNOSIS — B20 Human immunodeficiency virus [HIV] disease: Secondary | ICD-10-CM | POA: Diagnosis present

## 2022-10-12 DIAGNOSIS — Z21 Asymptomatic human immunodeficiency virus [HIV] infection status: Secondary | ICD-10-CM | POA: Diagnosis not present

## 2022-10-12 DIAGNOSIS — Z8719 Personal history of other diseases of the digestive system: Secondary | ICD-10-CM | POA: Insufficient documentation

## 2022-10-12 DIAGNOSIS — Z794 Long term (current) use of insulin: Secondary | ICD-10-CM | POA: Insufficient documentation

## 2022-10-12 LAB — CBC WITH DIFFERENTIAL/PLATELET
Abs Immature Granulocytes: 0.02 10*3/uL (ref 0.00–0.07)
Basophils Absolute: 0 10*3/uL (ref 0.0–0.1)
Basophils Relative: 0 %
Eosinophils Absolute: 0 10*3/uL (ref 0.0–0.5)
Eosinophils Relative: 0 %
HCT: 38.8 % — ABNORMAL LOW (ref 39.0–52.0)
Hemoglobin: 13.5 g/dL (ref 13.0–17.0)
Immature Granulocytes: 0 %
Lymphocytes Relative: 25 %
Lymphs Abs: 1.4 10*3/uL (ref 0.7–4.0)
MCH: 31.5 pg (ref 26.0–34.0)
MCHC: 34.8 g/dL (ref 30.0–36.0)
MCV: 90.7 fL (ref 80.0–100.0)
Monocytes Absolute: 0.6 10*3/uL (ref 0.1–1.0)
Monocytes Relative: 11 %
Neutro Abs: 3.6 10*3/uL (ref 1.7–7.7)
Neutrophils Relative %: 64 %
Platelets: 196 10*3/uL (ref 150–400)
RBC: 4.28 MIL/uL (ref 4.22–5.81)
RDW: 13.6 % (ref 11.5–15.5)
WBC: 5.7 10*3/uL (ref 4.0–10.5)
nRBC: 0 % (ref 0.0–0.2)

## 2022-10-12 LAB — COMPREHENSIVE METABOLIC PANEL
ALT: 16 U/L (ref 0–44)
AST: 14 U/L — ABNORMAL LOW (ref 15–41)
Albumin: 4 g/dL (ref 3.5–5.0)
Alkaline Phosphatase: 52 U/L (ref 38–126)
Anion gap: 7 (ref 5–15)
BUN: 11 mg/dL (ref 8–23)
CO2: 27 mmol/L (ref 22–32)
Calcium: 9.1 mg/dL (ref 8.9–10.3)
Chloride: 105 mmol/L (ref 98–111)
Creatinine, Ser: 0.87 mg/dL (ref 0.61–1.24)
GFR, Estimated: >60 mL/min (ref >60)
Glucose, Bld: 264 mg/dL — ABNORMAL HIGH (ref 70–99)
Potassium: 4 mmol/L (ref 3.5–5.1)
Sodium: 139 mmol/L (ref 135–145)
Total Bilirubin: 1.3 mg/dL — ABNORMAL HIGH (ref 0.3–1.2)
Total Protein: 7.5 g/dL (ref 6.5–8.1)

## 2022-10-12 NOTE — Progress Notes (Signed)
NAME: Phillip Edwards  DOB: 1961/05/27  MRN: [id number]  Date/Time: 10/12/2022 8:41 AM  Subjective:   ?follow up visit Last seen on Sep 2023. Since his last visit had diarrhea for 3 days. Resolved now On Biktarvy- 100% adherent Last VL 40 and cd4   Phillip Edwards is a 61 y.o. male with a history  DM, was recently in the hospital with acute diarrhea for 4 days and was diagnosed with   HIV on 05/04/22 Started on [place] while in the hospital Vl 712K on diagnosis Cd4 is 21  Acquired thru sex with Men He is bisexual Was married for  73yr and wife passed on in a MVA in 203/20/18Now has a male partner - she knows his status. She is on PreP  ? Past Medical History:  Diagnosis Date   Diabetes mellitus without complication ([place]    Dysuria 05/04/2022   Hyperlipidemia    Hypokalemia 05/05/2022   Hyponatremia 05/05/2022   Hypophosphatemia 05/05/2022   Leukocytosis 05/04/2022   Pancreatitis  Past Surgical History:  Procedure Laterality Date   COLONOSCOPY WITH PROPOFOL N/A 05/25/2017   Procedure: COLONOSCOPY WITH PROPOFOL;  Surgeon: SLollie Sails MD;  [facility];  Service: Endoscopy;  Laterality: N/A;   Cholecystectomy- lap Social History   Socioeconomic History   Marital status: Widowed    Spouse name: Not on file   Number of children: Not on file   Years of education: Not on file   Highest education level: Not on file  Occupational History   Not on file  Tobacco Use   Smoking status: Never   Smokeless tobacco: Never  Vaping Use   Vaping Use: Never used  Substance and Sexual Activity   Alcohol use: Yes    Alcohol/week: 4.0 standard drinks of alcohol    Types: 2 Cans of beer, 2 Standard drinks or equivalent per week   Drug use: No   Sexual activity: Yes    Partners: Male, Male    Birth control/protection: None  Other Topics Concern   Not on file  Social History Narrative   Not on file   Social Determinants of Health   Financial Resource Strain:  Not on file  Food Insecurity: Not on file  Transportation Needs: Not on file  Physical Activity: Not on file  Stress: Not on file  Social Connections: Not on file  Intimate Partner Violence: Not At Risk (12/28/2020)   Humiliation, Afraid, Rape, and Kick questionnaire    Fear of Current or Ex-Partner: No    Emotionally Abused: No    Physically Abused: No    Sexually Abused: No   Works at [employer] in mother and father  No Known Allergies ? Current Outpatient Medications  Medication Sig Dispense Refill   atorvastatin (LIPITOR) 40 MG tablet Take 40 mg by mouth daily.     BD VEO INSULIN SYRINGE U/F 31G X 15/64" 1 ML MISC USE 1 SYRINGE 2 (TWO) TIMES DAILY     bictegravir-emtricitabine-tenofovir AF (BIKTARVY) 50-200-25 MG TABS tablet Take 1 tablet by mouth daily. 30 tablet 3   Cetirizine HCl 10 MG CAPS Take 10 mg by mouth daily.     metFORMIN (GLUCOPHAGE) 1000 MG tablet Take 1,000 mg by mouth 2 (two) times daily with a meal.     ondansetron (ZOFRAN-ODT) 4 MG disintegrating tablet Take 4 mg by mouth every 8 (eight) hours as needed.     SEMGLEE, YFGN, 100 UNIT/ML injection SMARTSIG:100-120 Unit(s) SUB-Q Daily  sildenafil (VIAGRA) 100 MG tablet Take by mouth.     No current facility-administered medications for this visit.    REVIEW OF SYSTEMS:  Const: negative fever, negative chills, negative weight loss Eyes: negative diplopia or visual changes, negative eye pain ENT: negative coryza, negative sore throat Resp: negative cough, hemoptysis, dyspnea Cards: negative for chest pain, palpitations, lower extremity edema GU: negative for frequency, dysuria and hematuria Skin: negative for rash and pruritus Heme: negative for easy bruising and gum/nose bleeding MS: negative for myalgias, arthralgias, back pain and muscle weakness Neurolo:negative for headaches, dizziness, vertigo, memory problems  Psych: negative for feelings of anxiety, depression   Objective:  VITALS:  Ht  '5\' 9"'$  (1.753 m)   Wt 166 lb (75.3 kg)   BMI 24.51 kg/m  PHYSICAL EXAM:  General: Alert, cooperative, no distress, appears stated age.  Head: Normocephalic, without obvious abnormality, atraumatic. Eyes: Conjunctivae clear, anicteric sclerae. Pupils are equal Nose: Nares normal. No drainage or sinus tenderness. Throat: Lips, mucosa, and tongue normal. No Thrush Neck: Supple, symmetrical, no adenopathy, thyroid: non tender no carotid bruit and no JVD. Back: No CVA tenderness. Lungs: Clear to auscultation bilaterally. No Wheezing or Rhonchi. No rales. Heart: Regular rate and rhythm, no murmur, rub or gallop. Abdomen: Soft, non-tender,not distended. Bowel sounds normal. No masses Extremities: Extremities normal, atraumatic, no cyanosis. No edema. No clubbing Skin: No rashes or lesions. Not Jaundiced Lymph: Cervical, supraclavicular normal. Neurologic: Grossly non-focal Pertinent Labs  Health maintenance Vaccination  Vaccine Date last given comment  Influenza 08/15/22   Hepatitis B 07/13/22 ( 3rd dose)   Hepatitis A    Pneumococcal 20 05/18/22   RSV    TdaP 07/13/22   HPV    Shingrix ( zoster vaccine)     ______________________  Labs Lab Result  Date comment  HIV VL 490 7/17   CD4 440 7/17   Genotype     [id number]     HIV antibody     RPR NR 05/05/22   Quantiferon Gold NR 07/13/22   Hep C ab     Hepatitis B-ab,ag,c     Hepatitis A-IgM, IgG /T     Lipid 371/1427/28.7    GC/CHL     PAP     HB,PLT,Cr, LFT       Preventive  Procedure Result  Date comment  colonoscopy   Scheduled for Dec       Dental exam Nov 2023    Opthal       Impression/Recommendation ? ?AIDS  - VL was 712K on 05/04/22 and cd4 was 142, now 62 and cd4 is 440 cd4 from 05/22/22 On biktarvy  100% adherent Will check labs today  DM on metformin, insulin, glipizide PRN  Hyperlipidemia and hypertriglyceridemia On statin   Anal pap/ LGSIL on 05/18/22  Both male and male partners know his  staus Male partner tested negative Male partner is positive and on HAART  H/o pancreatitis Colonoscopy schdeuled next week  Reinforced adherence to meds, safe sex and vaccination  Follow up 4 months

## 2022-10-12 NOTE — Patient Instructions (Signed)
You are here for follow up of HIV- today will do labs- will follow up 3 months- will let you know of the lab results.

## 2022-10-13 LAB — T-HELPER CELLS CD4/CD8 %
% CD 4 Pos. Lymph.: 30.2 % — ABNORMAL LOW (ref 30.8–58.5)
Absolute CD 4 Helper: 423 /uL (ref 359–1519)
Basophils Absolute: 0 10*3/uL (ref 0.0–0.2)
Basos: 0 %
CD3+CD4+ Cells/CD3+CD8+ Cells Bld: 0.53 — ABNORMAL LOW (ref 0.92–3.72)
CD3+CD8+ Cells # Bld: 797 /uL (ref 109–897)
CD3+CD8+ Cells NFr Bld: 56.9 % — ABNORMAL HIGH (ref 12.0–35.5)
EOS (ABSOLUTE): 0 10*3/uL (ref 0.0–0.4)
Eos: 0 %
Hematocrit: 39.1 % (ref 37.5–51.0)
Hemoglobin: 13.5 g/dL (ref 13.0–17.7)
Immature Grans (Abs): 0 10*3/uL (ref 0.0–0.1)
Immature Granulocytes: 0 %
Lymphocytes Absolute: 1.4 10*3/uL (ref 0.7–3.1)
Lymphs: 25 %
MCH: 31.8 pg (ref 26.6–33.0)
MCHC: 34.5 g/dL (ref 31.5–35.7)
MCV: 92 fL (ref 79–97)
Monocytes Absolute: 0.6 10*3/uL (ref 0.1–0.9)
Monocytes: 11 %
Neutrophils Absolute: 3.6 10*3/uL (ref 1.4–7.0)
Neutrophils: 64 %
Platelets: 196 10*3/uL (ref 150–450)
RBC: 4.24 x10E6/uL (ref 4.14–5.80)
RDW: 13.8 % (ref 11.6–15.4)
WBC: 5.7 10*3/uL (ref 3.4–10.8)

## 2022-10-13 LAB — RPR: RPR Ser Ql: NONREACTIVE

## 2022-10-13 LAB — HIV-1 RNA QUANT-NO REFLEX-BLD
HIV 1 RNA Quant: <20 copies/mL
LOG10 HIV-1 RNA: UNDETERMINED log10copy/mL

## 2022-10-19 ENCOUNTER — Telehealth: Payer: Self-pay

## 2022-10-19 NOTE — Telephone Encounter (Signed)
I spoke to patient and relayed lab results to the patient. Patient would like to know more about the CD3 level. Please advise

## 2022-10-19 NOTE — Telephone Encounter (Signed)
-----   Message from Tsosie Billing, MD sent at 10/19/2022 12:20 PM EST ----- Please let him know that VL is undetectable < 20 and cd4 is 432 ( was 168 before) .  blood glucose , was high. Thx  ----- Message ----- From: Buel Ream, Lab In [place] Sent: 10/12/2022  11:16 AM EST To: Tsosie Billing, MD

## 2022-12-14 ENCOUNTER — Other Ambulatory Visit: Payer: Self-pay

## 2022-12-14 MED ORDER — BIKTARVY 50-200-25 MG PO TABS: 1.0000 | ORAL_TABLET | Freq: Every day | ORAL | 0 refills | Status: DC

## 2023-01-23 ENCOUNTER — Encounter: Payer: Self-pay | Admitting: Infectious Diseases

## 2023-01-23 ENCOUNTER — Ambulatory Visit: Payer: BC Managed Care – PPO | Attending: Infectious Diseases | Admitting: Infectious Diseases

## 2023-01-23 VITALS — BP 151/86 | HR 80 | Temp 96.4°F | Ht 69.0 in | Wt 166.0 lb

## 2023-01-23 DIAGNOSIS — E781 Pure hyperglyceridemia: Secondary | ICD-10-CM | POA: Insufficient documentation

## 2023-01-23 DIAGNOSIS — Z794 Long term (current) use of insulin: Secondary | ICD-10-CM | POA: Insufficient documentation

## 2023-01-23 DIAGNOSIS — Z21 Asymptomatic human immunodeficiency virus [HIV] infection status: Secondary | ICD-10-CM | POA: Insufficient documentation

## 2023-01-23 DIAGNOSIS — B20 Human immunodeficiency virus [HIV] disease: Secondary | ICD-10-CM | POA: Diagnosis not present

## 2023-01-23 DIAGNOSIS — Z7984 Long term (current) use of oral hypoglycemic drugs: Secondary | ICD-10-CM | POA: Insufficient documentation

## 2023-01-23 DIAGNOSIS — E119 Type 2 diabetes mellitus without complications: Secondary | ICD-10-CM | POA: Insufficient documentation

## 2023-01-23 MED ORDER — BIKTARVY 50-200-25 MG PO TABS
1.0000 | ORAL_TABLET | Freq: Every day | ORAL | 4 refills | Status: DC
Start: 2023-01-23 — End: 2023-07-16

## 2023-01-23 NOTE — Progress Notes (Signed)
NAME: Phillip Edwards  DOB: 07-06-1961  MRN: [id number]  Date/Time: 01/23/2023 8:40 AM  Subjective:   ?follow up visit Last seen on Dec 2023. Since last visit saw his PCP and is being referred to endocrinologist for better control of BS He had to reschedule his colonoscopy for June On Biktarvy- 100% adherent Last VL <20 and cd4 is [place] is a 62 y.o. male with a history  DM, was recently in the hospital with acute diarrhea for 4 days and was diagnosed with   HIV on 05/04/22 Started on [place] while in the hospital Vl 712K on diagnosis Cd4 is 53  Acquired thru sex with Men He is bisexual Was married for  37yrs and wife passed on in a MVA in 2017/02/23 Now has a male partner - she knows his status. She is on PreP  ? Past Medical History:  Diagnosis Date   Diabetes mellitus without complication ([place])    Dysuria 05/04/2022   Hyperlipidemia    Hypokalemia 05/05/2022   Hyponatremia 05/05/2022   Hypophosphatemia 05/05/2022   Leukocytosis 05/04/2022   Pancreatitis  Past Surgical History:  Procedure Laterality Date   COLONOSCOPY WITH PROPOFOL N/A 05/25/2017   Procedure: COLONOSCOPY WITH PROPOFOL;  Surgeon: Lollie Sails, MD;  Location: [facility] ENDOSCOPY;  Service: Endoscopy;  Laterality: N/A;   Cholecystectomy- lap Social History   Socioeconomic History   Marital status: Widowed    Spouse name: Not on file   Number of children: Not on file   Years of education: Not on file   Highest education level: Not on file  Occupational History   Not on file  Tobacco Use   Smoking status: Never   Smokeless tobacco: Never  Vaping Use   Vaping Use: Never used  Substance and Sexual Activity   Alcohol use: Yes    Alcohol/week: 4.0 standard drinks of alcohol    Types: 2 Cans of beer, 2 Standard drinks or equivalent per week   Drug use: No   Sexual activity: Yes    Partners: Male, Male    Birth control/protection: None  Other Topics Concern   Not on file  Social History  Narrative   Not on file   Social Determinants of Health   Financial Resource Strain: Not on file  Food Insecurity: Not on file  Transportation Needs: Not on file  Physical Activity: Not on file  Stress: Not on file  Social Connections: Not on file  Intimate Partner Violence: Not At Risk (12/28/2020)   Humiliation, Afraid, Rape, and Kick questionnaire    Fear of Current or Ex-Partner: No    Emotionally Abused: No    Physically Abused: No    Sexually Abused: No   Works at [employer]  [place] DM in mother and father Crohns mom and sister  No Known Allergies ? Current Outpatient Medications  Medication Sig Dispense Refill   atorvastatin (LIPITOR) 40 MG tablet Take 40 mg by mouth daily.     BD VEO INSULIN SYRINGE U/F 31G X 15/64" 1 ML MISC USE 1 SYRINGE 2 (TWO) TIMES DAILY     bictegravir-emtricitabine-tenofovir AF (BIKTARVY) 50-200-25 MG TABS tablet Take 1 tablet by mouth daily. 30 tablet 0   Cetirizine HCl 10 MG CAPS Take 10 mg by mouth daily.     LEVEMIR 100 UNIT/ML injection Inject 100-200 units into the skin SQ once a day     metFORMIN (GLUCOPHAGE) 1000 MG tablet Take 1,000 mg by mouth 2 (two) times  daily with a meal.     ondansetron (ZOFRAN-ODT) 4 MG disintegrating tablet Take 4 mg by mouth every 8 (eight) hours as needed.     SEMGLEE, YFGN, 100 UNIT/ML injection SMARTSIG:100-120 Unit(s) SUB-Q Daily     sildenafil (VIAGRA) 100 MG tablet Take by mouth.     No current facility-administered medications for this visit.    REVIEW OF SYSTEMS:  Const: negative fever, negative chills, negative weight loss Eyes: negative diplopia or visual changes, negative eye pain ENT: negative coryza, negative sore throat Resp: negative cough, hemoptysis, dyspnea Cards: negative for chest pain, palpitations, lower extremity edema GU: negative for frequency, dysuria and hematuria Skin: negative for rash and pruritus Heme: negative for easy bruising and gum/nose bleeding MS: negative for  myalgias, arthralgias, back pain and muscle weakness Neurolo:negative for headaches, dizziness, vertigo, memory problems  Psych: negative for feelings of anxiety, depression   Objective:  VITALS:  BP (!) 151/86   Pulse 80   Temp (!) 96.4 F (35.8 C) (Temporal)   Ht 5\' 9"  (1.753 m)   Wt 166 lb (75.3 kg)   BMI 24.51 kg/m  PHYSICAL EXAM:  General: Alert, cooperative, no distress, appears stated age.  Head: Normocephalic, without obvious abnormality, atraumatic. Eyes: Conjunctivae clear, anicteric sclerae. Pupils are equal Nose: Nares normal. No drainage or sinus tenderness. Throat: Lips, mucosa, and tongue normal. No Thrush Neck: Supple, symmetrical, no adenopathy, thyroid: non tender no carotid bruit and no JVD. Back: No CVA tenderness. Lungs: Clear to auscultation bilaterally. No Wheezing or Rhonchi. No rales. Heart: Regular rate and rhythm, no murmur, rub or gallop. Abdomen: Soft, non-tender,not distended. Bowel sounds normal. No masses Extremities: Extremities normal, atraumatic, no cyanosis. No edema. No clubbing Skin: No rashes or lesions. Not Jaundiced Lymph: Cervical, supraclavicular normal. Neurologic: Grossly non-focal Pertinent Labs  Health maintenance Vaccination  Vaccine Date last given comment  Influenza 08/15/22   Hepatitis B 07/13/22 ( 3rd dose)   Hepatitis A    Pneumococcal 20 05/18/22   RSV    TdaP 07/13/22   HPV    Shingrix ( zoster vaccine)     ______________________  Labs Lab Result  Date comment  HIV VL <20 12/23   CD4 423 12/23   Genotype     [id number]     HIV antibody     RPR NR 12/23   Quantiferon Gold NR 07/13/22   Hep C ab     Hepatitis B-ab,ag,c     Hepatitis A-IgM, IgG /T     Lipid 371/1427/28.7    GC/CHL     PAP     HB,PLT,Cr, LFT       Preventive  Procedure Result  Date comment  colonoscopy   Scheduled for Dec       Dental exam Nov 2023    Opthal       Impression/Recommendation ? ?AIDS  -  On biktarvy  100% adherent- Vl  < 20 Cd  4 is > 400 Will check labs next visit  DM on metformin, insulin, he has an appt with endocrinologist in April Hyperlipidemia and hypertriglyceridemia On statin   Anal pap/ LGSIL on 05/18/22, will check next appt in June  Both male and male partners know his staus Male partner tested negative Male partner is positive and on HAART  H/o pancreatitis   Reinforced adherence to meds, safe sex  Follow up 3 months- labs will be done Anal pap and screening

## 2023-04-24 ENCOUNTER — Encounter: Payer: Self-pay | Admitting: Internal Medicine

## 2023-04-25 ENCOUNTER — Ambulatory Visit: Payer: BC Managed Care – PPO | Admitting: Anesthesiology

## 2023-04-25 ENCOUNTER — Ambulatory Visit
Admission: RE | Admit: 2023-04-25 | Discharge: 2023-04-25 | Disposition: A | Discharge status: Home / Self Care | Payer: BC Managed Care – PPO | Attending: Internal Medicine | Admitting: Internal Medicine

## 2023-04-25 ENCOUNTER — Encounter
Admission: RE | Disposition: A | Discharge status: Home / Self Care | Payer: Self-pay | Source: Home / Self Care | Attending: Internal Medicine

## 2023-04-25 ENCOUNTER — Encounter: Payer: Self-pay | Admitting: Internal Medicine

## 2023-04-25 DIAGNOSIS — Z7984 Long term (current) use of oral hypoglycemic drugs: Secondary | ICD-10-CM | POA: Diagnosis not present

## 2023-04-25 DIAGNOSIS — Z09 Encounter for follow-up examination after completed treatment for conditions other than malignant neoplasm: Secondary | ICD-10-CM | POA: Insufficient documentation

## 2023-04-25 DIAGNOSIS — Z86010 Personal history of colonic polyps: Secondary | ICD-10-CM | POA: Diagnosis not present

## 2023-04-25 DIAGNOSIS — E119 Type 2 diabetes mellitus without complications: Secondary | ICD-10-CM | POA: Insufficient documentation

## 2023-04-25 DIAGNOSIS — E785 Hyperlipidemia, unspecified: Secondary | ICD-10-CM | POA: Insufficient documentation

## 2023-04-25 DIAGNOSIS — Z7985 Long-term (current) use of injectable non-insulin antidiabetic drugs: Secondary | ICD-10-CM | POA: Diagnosis not present

## 2023-04-25 DIAGNOSIS — K640 First degree hemorrhoids: Secondary | ICD-10-CM | POA: Insufficient documentation

## 2023-04-25 DIAGNOSIS — Z1211 Encounter for screening for malignant neoplasm of colon: Secondary | ICD-10-CM | POA: Diagnosis present

## 2023-04-25 DIAGNOSIS — Z794 Long term (current) use of insulin: Secondary | ICD-10-CM | POA: Diagnosis not present

## 2023-04-25 DIAGNOSIS — Z21 Asymptomatic human immunodeficiency virus [HIV] infection status: Secondary | ICD-10-CM | POA: Insufficient documentation

## 2023-04-25 HISTORY — PX: COLONOSCOPY WITH PROPOFOL: SHX5780

## 2023-04-25 HISTORY — DX: Human immunodeficiency virus (HIV) disease: B20

## 2023-04-25 LAB — GLUCOSE, CAPILLARY: Glucose-Capillary: 132 mg/dL — ABNORMAL HIGH (ref 70–99)

## 2023-04-25 SURGERY — COLONOSCOPY WITH PROPOFOL
Anesthesia: General

## 2023-04-25 MED ORDER — LIDOCAINE HCL (CARDIAC) PF 100 MG/5ML IV SOSY
PREFILLED_SYRINGE | INTRAVENOUS | Status: DC | PRN
  Administered 2023-04-25: 50 mg via INTRAVENOUS

## 2023-04-25 MED ORDER — PHENYLEPHRINE 80 MCG/ML (10ML) SYRINGE FOR IV PUSH (FOR BLOOD PRESSURE SUPPORT)
PREFILLED_SYRINGE | INTRAVENOUS | Status: DC | PRN
  Administered 2023-04-25: 120 ug via INTRAVENOUS
  Administered 2023-04-25: 80 ug via INTRAVENOUS

## 2023-04-25 MED ORDER — PROPOFOL 500 MG/50ML IV EMUL
INTRAVENOUS | Status: DC | PRN
  Administered 2023-04-25: 100 ug/kg/min via INTRAVENOUS

## 2023-04-25 MED ORDER — PHENYLEPHRINE 80 MCG/ML (10ML) SYRINGE FOR IV PUSH (FOR BLOOD PRESSURE SUPPORT)
PREFILLED_SYRINGE | INTRAVENOUS | Status: AC
  Filled 2023-04-25: qty 10

## 2023-04-25 MED ORDER — PROPOFOL 10 MG/ML IV BOLUS
INTRAVENOUS | Status: DC | PRN
  Administered 2023-04-25: 120 mg via INTRAVENOUS

## 2023-04-25 MED ORDER — SODIUM CHLORIDE 0.9 % IV SOLN: INTRAVENOUS | Status: DC

## 2023-04-25 NOTE — Anesthesia Preprocedure Evaluation (Signed)
Anesthesia Evaluation  Patient identified by MRN, date of birth, ID band Patient awake    Reviewed: Allergy & Precautions, H&P , NPO status , Patient's Chart, lab work & pertinent test results  History of Anesthesia Complications Negative for: history of anesthetic complications  Airway Mallampati: III  TM Distance: <3 FB Neck ROM: full    Dental  (+) Chipped, Caps   Pulmonary neg pulmonary ROS, neg shortness of breath, neg recent URI          Cardiovascular Exercise Tolerance: Good (-) angina (-) Past MI and (-) DOE negative cardio ROS      Neuro/Psych negative neurological ROS  negative psych ROS   GI/Hepatic Neg liver ROS, PUD,neg GERD  ,,  Endo/Other  diabetes, Type 2, Insulin Dependent    Renal/GU negative Renal ROS  negative genitourinary   Musculoskeletal   Abdominal   Peds  Hematology  (+) HIV  Anesthesia Other Findings Past Medical History: No date: Diabetes mellitus without complication (HCC) No date: Hyperlipidemia  History reviewed. No pertinent surgical history.     Reproductive/Obstetrics negative OB ROS                             Anesthesia Physical Anesthesia Plan  ASA: 3  Anesthesia Plan: General   Post-op Pain Management:    Induction: Intravenous  PONV Risk Score and Plan: Propofol infusion and TIVA  Airway Management Planned: Natural Airway and Nasal Cannula  Additional Equipment:   Intra-op Plan:   Post-operative Plan:   Informed Consent: I have reviewed the patients History and Physical, chart, labs and discussed the procedure including the risks, benefits and alternatives for the proposed anesthesia with the patient or authorized representative who has indicated his/her understanding and acceptance.     Dental Advisory Given  Plan Discussed with: Anesthesiologist, CRNA and Surgeon  Anesthesia Plan Comments: (Patient consented for risks of  anesthesia including but not limited to:  - adverse reactions to medications - risk of intubation if required - damage to teeth, lips or other oral mucosa - sore throat or hoarseness - Damage to heart, brain, lungs or loss of life  Patient voiced understanding.)        Anesthesia Quick Evaluation

## 2023-04-25 NOTE — Transfer of Care (Signed)
Immediate Anesthesia Transfer of Care Note  Patient: Phillip Edwards  Procedure(s) Performed: COLONOSCOPY WITH PROPOFOL  Patient Location: PACU and Endoscopy Unit  Anesthesia Type:General  Level of Consciousness: drowsy  Airway & Oxygen Therapy: Patient Spontanous Breathing  Post-op Assessment: Report given to RN and Post -op Vital signs reviewed and stable  Post vital signs: Reviewed and stable  Last Vitals:  Vitals Value Taken Time  BP 89/53 04/25/23 1457  Temp    Pulse 90 04/25/23 1458  Resp 13 04/25/23 1458  SpO2 97 % 04/25/23 1458  Vitals shown include unvalidated device data.  Last Pain:  Vitals:   04/25/23 1359  TempSrc: Temporal         Complications: No notable events documented.

## 2023-04-25 NOTE — H&P (Signed)
Outpatient short stay form Pre-procedure 04/25/2023 1:55 PM Raeleigh Guinn K. Norma Fredrickson, M.D.  Primary Physician: Dorothey Baseman, M.D.  Reason for visit:  Personal history of adenomatous colon polyps.  COLONOSCOPY 05/25/2017  Serrated adenoma/Repeat 79yrs/MUS (06/01/2022 Recall letter returned.awb)   History of present illness:                            Patient presents for colonoscopy for a personal hx of colon polyps. The patient denies abdominal pain, abnormal weight loss or rectal bleeding.      Current Facility-Administered Medications:    0.9 %  sodium chloride infusion, , Intravenous, Continuous, Shalita Notte, Boykin Nearing, MD  Medications Prior to Admission  Medication Sig Dispense Refill Last Dose   atorvastatin (LIPITOR) 40 MG tablet Take 40 mg by mouth daily.      BD VEO INSULIN SYRINGE U/F 31G X 15/64" 1 ML MISC USE 1 SYRINGE 2 (TWO) TIMES DAILY      bictegravir-emtricitabine-tenofovir AF (BIKTARVY) 50-200-25 MG TABS tablet Take 1 tablet by mouth daily. 30 tablet 4    Cetirizine HCl 10 MG CAPS Take 10 mg by mouth daily.      LEVEMIR 100 UNIT/ML injection Inject 100-200 units into the skin SQ once a day      metFORMIN (GLUCOPHAGE) 1000 MG tablet Take 1,000 mg by mouth 2 (two) times daily with a meal.      ondansetron (ZOFRAN-ODT) 4 MG disintegrating tablet Take 4 mg by mouth every 8 (eight) hours as needed.      SEMGLEE, YFGN, 100 UNIT/ML injection SMARTSIG:100-120 Unit(s) SUB-Q Daily      sildenafil (VIAGRA) 100 MG tablet Take by mouth.        No Known Allergies   Past Medical History:  Diagnosis Date   AIDS (HCC)    2023   Diabetes mellitus without complication (HCC)    Dysuria 05/04/2022   Hyperlipidemia    Hypokalemia 05/05/2022   Hyponatremia 05/05/2022   Hypophosphatemia 05/05/2022   Leukocytosis 05/04/2022    Review of systems:  Otherwise negative.    Physical Exam  Gen: Alert, oriented. Appears stated age.  HEENT: [place]/AT. PERRLA. Lungs: CTA, no wheezes. CV: RR nl  S1, S2. Abd: soft, benign, no masses. BS+ Ext: No edema. Pulses 2+    Planned procedures: Proceed with colonoscopy. The patient understands the nature of the planned procedure, indications, risks, alternatives and potential complications including but not limited to bleeding, infection, perforation, damage to internal organs and possible oversedation/side effects from anesthesia. The patient agrees and gives consent to proceed.  Please refer to procedure notes for findings, recommendations and patient disposition/instructions.     Tunisia Landgrebe K. Norma Fredrickson, M.D. Gastroenterology 04/25/2023  1:55 PM

## 2023-04-25 NOTE — Op Note (Signed)
[facility] Gastroenterology Patient Name: Phillip Edwards Procedure Date: 04/25/2023 2:07 PM MRN: [id number] Account #: [id number] Date of Birth: 09/07/1961 Admit Type: Outpatient Age: 62 Room: [facility] ENDO ROOM 2 Gender: Male Note Status: Finalized Instrument Name: Prentice Docker [id number] Procedure:             Colonoscopy Indications:           High risk colon cancer surveillance: Personal history                         of non-advanced adenoma Providers:             Boykin Nearing. Norma Fredrickson MD, MD Referring MD:          Boykin Nearing. Norma Fredrickson MD, MD (Referring MD), Teena Irani.                         Terance Hart, MD (Referring MD) Medicines:             Propofol per Anesthesia Complications:         No immediate complications. Procedure:             Pre-Anesthesia Assessment:                        - The risks and benefits of the procedure and the                         sedation options and risks were discussed with the                         patient. All questions were answered and informed                         consent was obtained.                        - Patient identification and proposed procedure were                         verified prior to the procedure by the nurse. The                         procedure was verified in the procedure room.                        - ASA Grade Assessment: III - A patient with severe                         systemic disease.                        - After reviewing the risks and benefits, the patient                         was deemed in satisfactory condition to undergo the                         procedure.                        After obtaining  informed consent, the colonoscope was                         passed under direct vision. Throughout the procedure,                         the patient's blood pressure, pulse, and oxygen                         saturations were monitored continuously. The                         Colonoscope was  introduced through the anus and                         advanced to the the cecum, identified by appendiceal                         orifice and ileocecal valve. The colonoscopy was                         performed without difficulty. The patient tolerated                         the procedure well. The quality of the bowel                         preparation was good. The ileocecal valve, appendiceal                         orifice, and rectum were photographed. Findings:      The perianal and digital rectal examinations were normal. Pertinent       negatives include normal sphincter tone and no palpable rectal lesions.      Non-bleeding internal hemorrhoids were found during retroflexion. The       hemorrhoids were Grade I (internal hemorrhoids that do not prolapse).      The exam was otherwise without abnormality. Impression:            - Non-bleeding internal hemorrhoids.                        - The examination was otherwise normal.                        - No specimens collected. Recommendation:        - Patient has a contact number available for                         emergencies. The signs and symptoms of potential                         delayed complications were discussed with the patient.                         Return to normal activities tomorrow. Written                         discharge instructions were provided to the patient.                        -  Resume previous diet.                        - Continue present medications.                        - Repeat colonoscopy in 10 years for screening                         purposes.                        - Return to GI office PRN.                        - The findings and recommendations were discussed with                         the patient. Procedure Code(s):     --- Professional ---                        Z6109, Colorectal cancer screening; colonoscopy on                         individual at high risk Diagnosis  Code(s):     --- Professional ---                        K64.0, First degree hemorrhoids                        Z86.010, Personal history of colonic polyps CPT copyright 2022 American Medical Association. All rights reserved. The codes documented in this report are preliminary and upon coder review may  be revised to meet current compliance requirements. Stanton Kidney MD, MD 04/25/2023 2:57:16 PM This report has been signed electronically. Number of Addenda: 0 Note Initiated On: 04/25/2023 2:07 PM Scope Withdrawal Time: 0 hours 5 minutes 52 seconds  Total Procedure Duration: 0 hours 8 minutes 4 seconds  Estimated Blood Loss:  Estimated blood loss: none.      [facility]

## 2023-04-26 ENCOUNTER — Encounter: Payer: Self-pay | Admitting: Internal Medicine

## 2023-04-30 NOTE — Anesthesia Postprocedure Evaluation (Signed)
Anesthesia Post Note  Patient: Phillip Edwards  Procedure(s) Performed: COLONOSCOPY WITH PROPOFOL  Patient location during evaluation: Endoscopy Anesthesia Type: General Level of consciousness: awake and alert Pain management: pain level controlled Vital Signs Assessment: post-procedure vital signs reviewed and stable Respiratory status: spontaneous breathing, nonlabored ventilation, respiratory function stable and patient connected to nasal cannula oxygen Cardiovascular status: blood pressure returned to baseline and stable Postop Assessment: no apparent nausea or vomiting Anesthetic complications: no   No notable events documented.   Last Vitals:  Vitals:   04/25/23 1517 04/25/23 1527  BP: 91/63 104/75  Pulse: (!) 101 87  Resp: 17 11  Temp:    SpO2: 97% 99%    Last Pain:  Vitals:   04/25/23 1527  TempSrc:   PainSc: 0-No pain                 Lenard Simmer

## 2023-05-01 ENCOUNTER — Other Ambulatory Visit (HOSPITAL_COMMUNITY)
Admission: RE | Admit: 2023-05-01 | Discharge: 2023-05-01 | Disposition: A | Discharge status: Home / Self Care | Payer: BC Managed Care – PPO | Source: Ambulatory Visit

## 2023-05-01 ENCOUNTER — Encounter: Payer: Self-pay | Admitting: Infectious Diseases

## 2023-05-01 ENCOUNTER — Ambulatory Visit: Payer: BC Managed Care – PPO | Attending: Infectious Diseases | Admitting: Infectious Diseases

## 2023-05-01 ENCOUNTER — Other Ambulatory Visit
Admission: RE | Admit: 2023-05-01 | Discharge: 2023-05-01 | Disposition: A | Discharge status: Home / Self Care | Payer: BC Managed Care – PPO | Source: Ambulatory Visit | Attending: Infectious Diseases | Admitting: Infectious Diseases

## 2023-05-01 VITALS — BP 147/80 | HR 90 | Temp 98.7°F | Wt 163.0 lb

## 2023-05-01 DIAGNOSIS — Z794 Long term (current) use of insulin: Secondary | ICD-10-CM | POA: Insufficient documentation

## 2023-05-01 DIAGNOSIS — E781 Pure hyperglyceridemia: Secondary | ICD-10-CM | POA: Insufficient documentation

## 2023-05-01 DIAGNOSIS — Z8719 Personal history of other diseases of the digestive system: Secondary | ICD-10-CM | POA: Insufficient documentation

## 2023-05-01 DIAGNOSIS — B20 Human immunodeficiency virus [HIV] disease: Secondary | ICD-10-CM | POA: Insufficient documentation

## 2023-05-01 DIAGNOSIS — E119 Type 2 diabetes mellitus without complications: Secondary | ICD-10-CM | POA: Insufficient documentation

## 2023-05-01 DIAGNOSIS — Z7984 Long term (current) use of oral hypoglycemic drugs: Secondary | ICD-10-CM | POA: Diagnosis not present

## 2023-05-01 LAB — CHLAMYDIA/NGC RT PCR (ARMC ONLY)
Chlamydia Tr: NOT DETECTED
Chlamydia Tr: NOT DETECTED
N gonorrhoeae: NOT DETECTED
N gonorrhoeae: NOT DETECTED

## 2023-05-01 NOTE — Progress Notes (Signed)
NAME: Phillip Edwards  DOB: 12-21-1960  MRN: [id number]  Date/Time: 05/01/2023 8:42 AM  Subjective:   ?follow up visit for HIV Last seen on March 2024 Had colonoscopy this week and is normal On Biktarvy- 100% adherent Last VL <20 and cd4 is 74  Phillip Edwards is a 62 y.o. male with a history  DM,  diagnosed with   HIV on 05/04/22 Started on [place] while in the hospital Vl 712K on diagnosis Cd4 is 3  Acquired thru sex with Men He is bisexual Was married for  82yrs and wife passed on in a MVA in 05-20-17 Now has a male partner - she knows his status. She is on PreP  ? Past Medical History:  Diagnosis Date   AIDS (HCC)    05-20-22   Diabetes mellitus without complication (HCC)    Dysuria 05/04/2022   Hyperlipidemia    Hypokalemia 05/05/2022   Hyponatremia 05/05/2022   Hypophosphatemia 05/05/2022   Leukocytosis 05/04/2022   Pancreatitis  Past Surgical History:  Procedure Laterality Date   COLONOSCOPY WITH PROPOFOL N/A 05/25/2017   Procedure: COLONOSCOPY WITH PROPOFOL;  Surgeon: Christena Deem, MD;  Location: [facility] ENDOSCOPY;  Service: Endoscopy;  Laterality: N/A;   COLONOSCOPY WITH PROPOFOL N/A 04/25/2023   Procedure: COLONOSCOPY WITH PROPOFOL;  Surgeon: [place], Boykin Nearing, MD;  Location: [facility] ENDOSCOPY;  Service: Gastroenterology;  Laterality: N/A;   Cholecystectomy- lap Social History   Socioeconomic History   Marital status: Widowed    Spouse name: Not on file   Number of children: Not on file   Years of education: Not on file   Highest education level: Not on file  Occupational History   Not on file  Tobacco Use   Smoking status: Never   Smokeless tobacco: Never  Vaping Use   Vaping Use: Never used  Substance and Sexual Activity   Alcohol use: Yes    Alcohol/week: 4.0 standard drinks of alcohol    Types: 2 Cans of beer, 2 Standard drinks or equivalent per week   Drug use: No   Sexual activity: Yes    Partners: Male, Male    Birth control/protection:  None  Other Topics Concern   Not on file  Social History Narrative   Not on file   Social Determinants of Health   Financial Resource Strain: Not on file  Food Insecurity: Not on file  Transportation Needs: Not on file  Physical Activity: Not on file  Stress: Not on file  Social Connections: Not on file  Intimate Partner Violence: Not At Risk (12/28/2020)   Humiliation, Afraid, Rape, and Kick questionnaire    Fear of Current or Ex-Partner: No    Emotionally Abused: No    Physically Abused: No    Sexually Abused: No   Works at [employer]  FH DM in mother and father Crohns mom and sister  No Known Allergies ? Current Outpatient Medications  Medication Sig Dispense Refill   atorvastatin (LIPITOR) 40 MG tablet Take 40 mg by mouth daily.     BD VEO INSULIN SYRINGE U/F 31G X 15/64" 1 ML MISC USE 1 SYRINGE 2 (TWO) TIMES DAILY     bictegravir-emtricitabine-tenofovir AF (BIKTARVY) 50-200-25 MG TABS tablet Take 1 tablet by mouth daily. 30 tablet 4   Cetirizine HCl 10 MG CAPS Take 10 mg by mouth daily.     Insulin Glargine (TOUJEO MAX SOLOSTAR [place]) Inject into the skin.     LEVEMIR 100 UNIT/ML injection Inject 100-200 units into  the skin SQ once a day     metFORMIN (GLUCOPHAGE) 1000 MG tablet Take 1,000 mg by mouth 2 (two) times daily with a meal.     ondansetron (ZOFRAN-ODT) 4 MG disintegrating tablet Take 4 mg by mouth every 8 (eight) hours as needed.     SEMGLEE, YFGN, 100 UNIT/ML injection SMARTSIG:100-120 Unit(s) SUB-Q Daily     sildenafil (VIAGRA) 100 MG tablet Take by mouth.     No current facility-administered medications for this visit.    REVIEW OF SYSTEMS:  Const: negative fever, negative chills, negative weight loss Eyes: negative diplopia or visual changes, negative eye pain ENT: negative coryza, negative sore throat Resp: negative cough, hemoptysis, dyspnea Cards: negative for chest pain, palpitations, lower extremity edema GU: negative for frequency, dysuria  and hematuria Skin: negative for rash and pruritus Heme: negative for easy bruising and gum/nose bleeding MS: negative for myalgias, arthralgias, back pain and muscle weakness Neurolo:negative for headaches, dizziness, vertigo, memory problems  Psych: negative for feelings of anxiety, depression   Objective:  VITALS:  BP (!) 147/80   Pulse 90   Temp 98.7 F (37.1 C) (Oral)   Wt 163 lb (73.9 kg)   SpO2 97%   BMI 24.07 kg/m  PHYSICAL EXAM:  General: Alert, cooperative, no distress, appears stated age.  Head: Normocephalic, without obvious abnormality, atraumatic. Eyes: Conjunctivae clear, anicteric sclerae. Pupils are equal Nose: Nares normal. No drainage or sinus tenderness. Throat: Lips, mucosa, and tongue normal. No Thrush Neck: Supple, symmetrical, no adenopathy, thyroid: non tender no carotid bruit and no JVD. Back: No CVA tenderness. Lungs: Clear to auscultation bilaterally. No Wheezing or Rhonchi. No rales. Heart: Regular rate and rhythm, no murmur, rub or gallop. Abdomen: Soft, non-tender,not distended. Bowel sounds normal. No masses Extremities: Extremities normal, atraumatic, no cyanosis. No edema. No clubbing Skin: No rashes or lesions. Not Jaundiced Lymph: Cervical, supraclavicular normal. Neurologic: Grossly non-focal Pertinent Labs  Health maintenance Vaccination  Vaccine Date last given comment  Influenza 08/15/22   Hepatitis B 07/13/22 ( 3rd dose)   Hepatitis A    Pneumococcal 20 05/18/22   RSV    TdaP 07/13/22   HPV    Shingrix ( zoster vaccine)     ______________________  Labs Lab Result  Date comment  HIV VL <20 12/23   CD4 423 12/23   Genotype     [id number]     HIV antibody     RPR NR 12/23   Quantiferon Gold NR 07/13/22   Hep C ab     Hepatitis B-ab,ag,c     Hepatitis A-IgM, IgG /T     Lipid 371/1427/28.7    GC/CHL     PAP     HB,PLT,Cr, LFT       Preventive  Procedure Result  Date comment  colonoscopy   Scheduled for Dec       Dental  exam Nov 2023    Opthal       Impression/Recommendation ? ?AIDS  -  On biktarvy  100% adherent- Vl < 20 Cd 4 is > 400 Will check labs today  DM on metformin, insulin, he has an appt with endocrinologist in April Hyperlipidemia and hypertriglyceridemia On statin   Anal pap GC Chl checked today  Both male and male partners know his staus Male partner tested negative Male partner is positive and on HAART  H/o pancreatitis   Reinforced adherence to meds, safe sex  Follow up 6 months-

## 2023-05-02 LAB — T-HELPER CELLS CD4/CD8 %
% CD 4 Pos. Lymph.: 33.9 % (ref 30.8–58.5)
Absolute CD 4 Helper: 509 /uL (ref 359–1519)
Basophils Absolute: 0 10*3/uL (ref 0.0–0.2)
Basos: 0 %
CD3+CD4+ Cells/CD3+CD8+ Cells Bld: 0.63 — ABNORMAL LOW (ref 0.92–3.72)
CD3+CD8+ Cells # Bld: 804 /uL (ref 109–897)
CD3+CD8+ Cells NFr Bld: 53.6 % — ABNORMAL HIGH (ref 12.0–35.5)
EOS (ABSOLUTE): 0.1 10*3/uL (ref 0.0–0.4)
Eos: 1 %
Hematocrit: 41 % (ref 37.5–51.0)
Hemoglobin: 13.9 g/dL (ref 13.0–17.7)
Immature Grans (Abs): 0 10*3/uL (ref 0.0–0.1)
Immature Granulocytes: 0 %
Lymphocytes Absolute: 1.5 10*3/uL (ref 0.7–3.1)
Lymphs: 25 %
MCH: 31.9 pg (ref 26.6–33.0)
MCHC: 33.9 g/dL (ref 31.5–35.7)
MCV: 94 fL (ref 79–97)
Monocytes Absolute: 0.6 10*3/uL (ref 0.1–0.9)
Monocytes: 10 %
Neutrophils Absolute: 3.9 10*3/uL (ref 1.4–7.0)
Neutrophils: 64 %
Platelets: 189 10*3/uL (ref 150–450)
RBC: 4.36 x10E6/uL (ref 4.14–5.80)
RDW: 13 % (ref 11.6–15.4)
WBC: 6.1 10*3/uL (ref 3.4–10.8)

## 2023-05-02 LAB — HIV-1 RNA QUANT-NO REFLEX-BLD
HIV 1 RNA Quant: 20 copies/mL
LOG10 HIV-1 RNA: 1.301 log10copy/mL

## 2023-05-02 LAB — RPR: RPR Ser Ql: NONREACTIVE

## 2023-05-04 LAB — CYTOLOGY - PAP: Diagnosis: HIGH — AB

## 2023-05-07 LAB — QUANTIFERON-TB GOLD PLUS (RQFGPL)
QuantiFERON Mitogen Value: >10 IU/mL
QuantiFERON Nil Value: 0.08 IU/mL
QuantiFERON TB1 Ag Value: 0.13 IU/mL
QuantiFERON TB2 Ag Value: 0.13 IU/mL

## 2023-05-07 LAB — QUANTIFERON-TB GOLD PLUS: QuantiFERON-TB Gold Plus: NEGATIVE

## 2023-05-08 ENCOUNTER — Telehealth: Payer: Self-pay

## 2023-05-08 ENCOUNTER — Other Ambulatory Visit: Payer: Self-pay

## 2023-05-08 DIAGNOSIS — R85619 Unspecified abnormal cytological findings in specimens from anus: Secondary | ICD-10-CM

## 2023-05-08 NOTE — Telephone Encounter (Signed)
Patient advised of lab results and also advised of abnormal anal pap. Patient referred to [facility] and advised that someone will reach out to him. Phillip Edwards Pricilla Loveless

## 2023-05-08 NOTE — Telephone Encounter (Signed)
-----   Message from Lynn Ito, MD sent at 05/03/2023  4:00 PM EDT ----- Please let him kone that his labs look good- anal pap is still pending- rest all  fine  ----- Message ----- From: Leory Plowman, [facility] [place] Sent: 05/01/2023  10:56 AM EDT To: Lynn Ito, MD

## 2023-06-05 ENCOUNTER — Ambulatory Visit: Payer: Self-pay | Admitting: General Surgery

## 2023-06-05 NOTE — H&P (View-Only) (Signed)
 REFERRING PHYSICIAN:  Lynn Ito,*  PROVIDER:  Elenora Gamma, MD  MRN: [id number] DOB: 18-Sep-1961 DATE OF ENCOUNTER: 06/05/2023  Subjective   Chief Complaint: New Consultation     History of Present Illness: Phillip Edwards is a 62 y.o. male who is seen today as an office consultation at the request of Dr. Rivka Safer for evaluation of New Consultation .   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  CD4 count is 509.  Recently underwent anal Pap test which showed high-grade squamous intraepithelial lesion.  Review of Systems: A complete review of systems was obtained from the patient.  I have reviewed this information and discussed as appropriate with the patient.  See HPI as well for other ROS.   Medical History: Past Medical History:  Diagnosis Date   Diabetes mellitus type 2, uncomplicated (CMS/HHS-HCC)    Hyperlipidemia    Serrated adenoma of colon 05/25/2017    Patient Active Problem List  Diagnosis   Type 2 diabetes mellitus without complication (CMS/HHS-HCC)   Hyperlipidemia, unspecified hyperlipidemia type   HIV 2 (human immunodeficiency virus type 2) (CMS/HHS-HCC)    Past Surgical History:  Procedure Laterality Date   COLONOSCOPY  05/25/2017   Serrated adenoma/Repeat 79yrs/MUS   (06/01/2022 Recall letter returned.awb)   COLONOSCOPY  04/25/2023   COLONOSCOPY  04/25/2023   PHxAdenoma/Rpt93yrs/TKT   CHOLECYSTECTOMY OPEN     VASECTOMY       No Known Allergies  Current Outpatient Medications on File Prior to Visit  Medication Sig Dispense Refill   atorvastatin (LIPITOR) 40 MG tablet TAKE 1 TABLET BY MOUTH EVERY DAY 90 tablet 1   BIKTARVY 50-200-25 mg tablet Take 1 tablet by mouth once daily     blood-glucose sensor (FREESTYLE LIBRE 3 SENSOR) Devi Use 1 Device every 14 (fourteen) days 2 each 11   metFORMIN (GLUCOPHAGE-XR) 750 MG XR tablet Take 2 tablets (1,500 mg total) by mouth daily with breakfast 180 tablet  3   sildenafiL (VIAGRA) 100 MG tablet TAKE 1/2-1 TAB AS NEEDED 4 tablet 7   bismuth subsalicylate (PEPTO BISMOL MAXIMUM STRENGTH) 525 mg/15 mL suspension Take 15 mLs by mouth 4 (four) times daily (Patient not taking: Reported on 05/02/2023)     blood glucose diagnostic test strip 1 each (1 strip total) 2 (two) times daily E11.9 ONE TOUCH ULTRA TEST STRIPS 100 each 2   glipiZIDE (GLUCOTROL) 5 MG tablet Take 1/2 to 1 tablet po PRN for when you have or expect high BS'S (Patient not taking: Reported on 06/05/2023) 90 tablet 3   insulin glargine U-300 conc (TOUJEO MAX U-300 SOLOSTAR) 300 unit/mL (3 mL) InPn Inject 150 Units subcutaneously once daily (Patient taking differently: Inject 120 Units subcutaneously once daily) 18 mL 5   insulin syringe-needle U-100 (BD VEO INSULIN SYRINGE UF) 1 mL 31 gauge x 15/64" Syrg Use 1 Syringe 2 (two) times daily 180 each 1   ondansetron (ZOFRAN-ODT) 4 MG disintegrating tablet Take 4 mg by mouth every 8 (eight) hours as needed (Patient not taking: Reported on 06/05/2023)     pen needle, diabetic (BD ULTRA-FINE SHORT PEN NEEDLE) 31 gauge x 5/16" needle 2 (two) times daily 180 each 1   pen needle, diabetic 31 gauge x 3/16" needle Use as directed 100 each 12   sodium, potassium, and magnesium (SUPREP) oral solution Take 1 Bottle by mouth as directed One kit contains 2 bottles.  Take both bottles at the times instructed by your provider. (Patient not taking: Reported on  01/25/2023) 354 mL 0   No current facility-administered medications on file prior to visit.    Family History  Problem Relation Age of Onset   Cataracts Mother    Diabetes type II Father    Diabetes Father    Diabetes type II Paternal Uncle    Diabetes Paternal Uncle    Breast cancer Paternal Grandmother    Diabetes type II Cousin    Diabetes Cousin      Social History   Tobacco Use  Smoking Status Never  Smokeless Tobacco Never     Social History   Socioeconomic History   Marital status:  Single  Tobacco Use   Smoking status: Never   Smokeless tobacco: Never  Vaping Use   Vaping status: Never Used  Substance and Sexual Activity   Alcohol use: Not Currently   Drug use: Never   Sexual activity: Yes    Objective:    Vitals:   06/05/23 1004 06/05/23 1005  BP: 132/78   Pulse: 88   Temp: 36.8 C (98.2 F)   SpO2: 97%   Weight: 72.9 kg (160 lb 12.8 oz)   Height: 175.3 cm (5\' 9" )   PainSc:  0-No pain  PainLoc:  Abdomen     Exam Gen: NAD Abd: soft    Labs, Imaging and Diagnostic Testing: See above.    Assessment and Plan:  Diagnoses and all orders for this visit:  Abnormal Pap smear of anus    62 year old recently diagnosed HIV-positive male who presents to the office with HSIL of the anal canal on Pap test.  We discussed this in detail today.  I have recommended high-resolution anoscopy with biopsy, excision or ablation of abnormal lesions.  We discussed the typical postoperative pain associated with this and risk of recurrence in the future.  All questions were answered.  Patient agrees to proceed.  Vanita Panda, MD Colon and Rectal Surgery [facility]

## 2023-06-05 NOTE — H&P (Signed)
REFERRING PHYSICIAN:  Lynn Ito,*  PROVIDER:  Elenora Gamma, MD  MRN: [id number] DOB: 18-Sep-1961 DATE OF ENCOUNTER: 06/05/2023  Subjective   Chief Complaint: New Consultation     History of Present Illness: Phillip Edwards is a 62 y.o. male who is seen today as an office consultation at the request of Dr. Rivka Safer for evaluation of New Consultation .   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  CD4 count is 509.  Recently underwent anal Pap test which showed high-grade squamous intraepithelial lesion.  Review of Systems: A complete review of systems was obtained from the patient.  I have reviewed this information and discussed as appropriate with the patient.  See HPI as well for other ROS.   Medical History: Past Medical History:  Diagnosis Date   Diabetes mellitus type 2, uncomplicated (CMS/HHS-HCC)    Hyperlipidemia    Serrated adenoma of colon 05/25/2017    Patient Active Problem List  Diagnosis   Type 2 diabetes mellitus without complication (CMS/HHS-HCC)   Hyperlipidemia, unspecified hyperlipidemia type   HIV 2 (human immunodeficiency virus type 2) (CMS/HHS-HCC)    Past Surgical History:  Procedure Laterality Date   COLONOSCOPY  05/25/2017   Serrated adenoma/Repeat 79yrs/MUS   (06/01/2022 Recall letter returned.awb)   COLONOSCOPY  04/25/2023   COLONOSCOPY  04/25/2023   PHxAdenoma/Rpt93yrs/TKT   CHOLECYSTECTOMY OPEN     VASECTOMY       No Known Allergies  Current Outpatient Medications on File Prior to Visit  Medication Sig Dispense Refill   atorvastatin (LIPITOR) 40 MG tablet TAKE 1 TABLET BY MOUTH EVERY DAY 90 tablet 1   BIKTARVY 50-200-25 mg tablet Take 1 tablet by mouth once daily     blood-glucose sensor (FREESTYLE LIBRE 3 SENSOR) Devi Use 1 Device every 14 (fourteen) days 2 each 11   metFORMIN (GLUCOPHAGE-XR) 750 MG XR tablet Take 2 tablets (1,500 mg total) by mouth daily with breakfast 180 tablet  3   sildenafiL (VIAGRA) 100 MG tablet TAKE 1/2-1 TAB AS NEEDED 4 tablet 7   bismuth subsalicylate (PEPTO BISMOL MAXIMUM STRENGTH) 525 mg/15 mL suspension Take 15 mLs by mouth 4 (four) times daily (Patient not taking: Reported on 05/02/2023)     blood glucose diagnostic test strip 1 each (1 strip total) 2 (two) times daily E11.9 ONE TOUCH ULTRA TEST STRIPS 100 each 2   glipiZIDE (GLUCOTROL) 5 MG tablet Take 1/2 to 1 tablet po PRN for when you have or expect high BS'S (Patient not taking: Reported on 06/05/2023) 90 tablet 3   insulin glargine U-300 conc (TOUJEO MAX U-300 SOLOSTAR) 300 unit/mL (3 mL) InPn Inject 150 Units subcutaneously once daily (Patient taking differently: Inject 120 Units subcutaneously once daily) 18 mL 5   insulin syringe-needle U-100 (BD VEO INSULIN SYRINGE UF) 1 mL 31 gauge x 15/64" Syrg Use 1 Syringe 2 (two) times daily 180 each 1   ondansetron (ZOFRAN-ODT) 4 MG disintegrating tablet Take 4 mg by mouth every 8 (eight) hours as needed (Patient not taking: Reported on 06/05/2023)     pen needle, diabetic (BD ULTRA-FINE SHORT PEN NEEDLE) 31 gauge x 5/16" needle 2 (two) times daily 180 each 1   pen needle, diabetic 31 gauge x 3/16" needle Use as directed 100 each 12   sodium, potassium, and magnesium (SUPREP) oral solution Take 1 Bottle by mouth as directed One kit contains 2 bottles.  Take both bottles at the times instructed by your provider. (Patient not taking: Reported on  01/25/2023) 354 mL 0   No current facility-administered medications on file prior to visit.    Family History  Problem Relation Age of Onset   Cataracts Mother    Diabetes type II Father    Diabetes Father    Diabetes type II Paternal Uncle    Diabetes Paternal Uncle    Breast cancer Paternal Grandmother    Diabetes type II Cousin    Diabetes Cousin      Social History   Tobacco Use  Smoking Status Never  Smokeless Tobacco Never     Social History   Socioeconomic History   Marital status:  Single  Tobacco Use   Smoking status: Never   Smokeless tobacco: Never  Vaping Use   Vaping status: Never Used  Substance and Sexual Activity   Alcohol use: Not Currently   Drug use: Never   Sexual activity: Yes    Objective:    Vitals:   06/05/23 1004 06/05/23 1005  BP: 132/78   Pulse: 88   Temp: 36.8 C (98.2 F)   SpO2: 97%   Weight: 72.9 kg (160 lb 12.8 oz)   Height: 175.3 cm (5\' 9" )   PainSc:  0-No pain  PainLoc:  Abdomen     Exam Gen: NAD Abd: soft    Labs, Imaging and Diagnostic Testing: See above.    Assessment and Plan:  Diagnoses and all orders for this visit:  Abnormal Pap smear of anus    62 year old recently diagnosed HIV-positive male who presents to the office with HSIL of the anal canal on Pap test.  We discussed this in detail today.  I have recommended high-resolution anoscopy with biopsy, excision or ablation of abnormal lesions.  We discussed the typical postoperative pain associated with this and risk of recurrence in the future.  All questions were answered.  Patient agrees to proceed.  Vanita Panda, MD Colon and Rectal Surgery [facility]

## 2023-06-08 ENCOUNTER — Ambulatory Visit
Admission: EM | Admit: 2023-06-08 | Discharge: 2023-06-08 | Disposition: A | Discharge status: Home / Self Care | Payer: BC Managed Care – PPO | Attending: Family Medicine | Admitting: Family Medicine

## 2023-06-08 DIAGNOSIS — J069 Acute upper respiratory infection, unspecified: Secondary | ICD-10-CM | POA: Insufficient documentation

## 2023-06-08 DIAGNOSIS — Z1152 Encounter for screening for COVID-19: Secondary | ICD-10-CM | POA: Diagnosis present

## 2023-06-08 MED ORDER — BENZONATATE 200 MG PO CAPS: 200.0000 mg | ORAL_CAPSULE | Freq: Three times a day (TID) | ORAL | 0 refills | Status: DC | PRN

## 2023-06-08 MED ORDER — AZITHROMYCIN 250 MG PO TABS: ORAL_TABLET | ORAL | 0 refills | Status: DC

## 2023-06-08 NOTE — ED Provider Notes (Signed)
[facility]    CSN: [id number] Arrival date & time: 06/08/23  1511      History   Chief Complaint Chief Complaint  Patient presents with   Cough   Nasal Congestion    HPI Phillip Edwards is a 62 y.o. male, medical history significant for AIDS, DM II, Type 2 Diabetes.  HPI Patient presents with a one day history generalized body aches, severe nasal congestion, facial pressure, nasal drainage, and nasal congestion. Uncertain of fever although has been taking tylenol. He reports no known sick contacts. Cough is dry, non-productive, with some chest tightness. Denies shortness of breath or weakness. Endorses glucose readings have been lower than baseline. Past Medical History:  Diagnosis Date   AIDS (HCC)    2023   Diabetes mellitus without complication (HCC)    Dysuria 05/04/2022   Hyperlipidemia    Hypokalemia 05/05/2022   Hyponatremia 05/05/2022   Hypophosphatemia 05/05/2022   Leukocytosis 05/04/2022    Patient Active Problem List   Diagnosis Date Noted   High anion gap metabolic acidosis 05/05/2022   E coli bacteremia 05/05/2022   Bacteremia due to Escherichia coli 05/05/2022   Protein-calorie malnutrition, severe 05/05/2022   HIV positive (HCC) 05/05/2022   Diarrhea 05/04/2022   Insulin dependent type 2 diabetes mellitus (HCC) 05/04/2022   High risk sexual behavior 05/04/2022   Pancolitis (HCC) 05/04/2022   Shigella dysenteriae 05/04/2022   Hyperlipidemia 03/05/2015   Type 2 diabetes mellitus without complication (HCC) 03/05/2015    Past Surgical History:  Procedure Laterality Date   COLONOSCOPY WITH PROPOFOL N/A 05/25/2017   Procedure: COLONOSCOPY WITH PROPOFOL;  Surgeon: Christena Deem, MD;  Location: [facility] ENDOSCOPY;  Service: Endoscopy;  Laterality: N/A;   COLONOSCOPY WITH PROPOFOL N/A 04/25/2023   Procedure: COLONOSCOPY WITH PROPOFOL;  Surgeon: Toledo, Boykin Nearing, MD;  Location: [facility] ENDOSCOPY;  Service: Gastroenterology;  Laterality: N/A;        Home Medications    Prior to Admission medications   Medication Sig Start Date End Date Taking? Authorizing Provider  azithromycin (ZITHROMAX) 250 MG tablet Take 2 tabs PO x 1 dose, then 1 tab PO QD x 4 days 06/08/23  Yes Bing Neighbors, NP  benzonatate (TESSALON) 200 MG capsule Take 1 capsule (200 mg total) by mouth 3 (three) times daily as needed for cough. 06/08/23  Yes Bing Neighbors, NP  atorvastatin (LIPITOR) 40 MG tablet Take 40 mg by mouth daily. 08/27/22   [provider]  BD VEO INSULIN SYRINGE U/F 31G X 15/64" 1 ML MISC USE 1 SYRINGE 2 (TWO) TIMES DAILY 07/31/22   [provider]  bictegravir-emtricitabine-tenofovir AF (BIKTARVY) 50-200-25 MG TABS tablet Take 1 tablet by mouth daily. 01/23/23   Lynn Ito, MD  Cetirizine HCl 10 MG CAPS Take 10 mg by mouth daily.    [provider]  Insulin Glargine (TOUJEO MAX SOLOSTAR [place]) Inject into the skin.    [provider]  LEVEMIR 100 UNIT/ML injection Inject 100-200 units into the skin SQ once a day 01/15/23   [provider]  metFORMIN (GLUCOPHAGE) 1000 MG tablet Take 1,000 mg by mouth 2 (two) times daily with a meal.    [provider]  ondansetron (ZOFRAN-ODT) 4 MG disintegrating tablet Take 4 mg by mouth every 8 (eight) hours as needed. 05/03/22   [provider]  SEMGLEE, YFGN, 100 UNIT/ML injection SMARTSIG:100-120 Unit(s) SUB-Q Daily 04/20/22   [provider]  sildenafil (VIAGRA) 100 MG tablet Take by mouth. 05/03/22  [provider]    Family History History reviewed. No pertinent family history.  Social History Social History   Tobacco Use   Smoking status: Never   Smokeless tobacco: Never  Vaping Use   Vaping status: Never Used  Substance Use Topics   Alcohol use: Yes    Alcohol/week: 4.0 standard drinks of alcohol    Types: 2 Cans of beer, 2 Standard drinks or equivalent per week   Drug use: No     Allergies    Patient has no known allergies.  Review of Systems Review of Systems Pertinent negatives listed in HPI   Physical Exam Triage Vital Signs ED Triage Vitals [06/08/23 1549]  Encounter Vitals Group     BP 134/74     Systolic BP Percentile      Diastolic BP Percentile      Pulse Rate 92     Resp 18     Temp 98 F (36.7 C)     Temp Source Oral     SpO2 96 %     Weight      Height      Head Circumference      Peak Flow      Pain Score      Pain Loc      Pain Education      Exclude from Growth Chart    No data found.  Updated Vital Signs BP 134/74 (BP Location: Left Arm)   Pulse 92   Temp 98 F (36.7 C) (Oral)   Resp 18   SpO2 96%   Visual Acuity Right Eye Distance:   Left Eye Distance:   Bilateral Distance:    Right Eye Near:   Left Eye Near:    Bilateral Near:     Physical Exam Vitals reviewed.  Constitutional:      Appearance: Normal appearance.  HENT:     Head: Normocephalic and atraumatic.     Right Ear: Tympanic membrane, ear canal and external ear normal.     Left Ear: Tympanic membrane, ear canal and external ear normal.     Nose: Congestion and rhinorrhea present.  Eyes:     Extraocular Movements: Extraocular movements intact.     Conjunctiva/sclera: Conjunctivae normal.     Pupils: Pupils are equal, round, and reactive to light.  Cardiovascular:     Rate and Rhythm: Normal rate and regular rhythm.  Pulmonary:     Effort: Pulmonary effort is normal.     Breath sounds: Normal breath sounds.  Musculoskeletal:     Cervical back: Normal range of motion and neck supple.  Lymphadenopathy:     Cervical: Cervical adenopathy present.  Skin:    General: Skin is warm and dry.  Neurological:     General: No focal deficit present.     Mental Status: He is alert.      UC Treatments / Results  Labs (all labs ordered are listed, but only abnormal results are displayed) Labs Reviewed  SARS CORONAVIRUS 2 (TAT 6-24 HRS)    EKG   Radiology No  results found.  Procedures Procedures (including critical care time)  Medications Ordered in UC Medications - No data to display  Initial Impression / Assessment and Plan / UC Course  I have reviewed the triage vital signs and the nursing notes.  Pertinent labs & imaging results that were available during my care of the patient were reviewed by me and considered in my medical decision making (see chart for details).  Viral Illness , COVID Test pending. Given patient is immunocompromised will cover with Azithromycin, concern fo opportunistic infection such as pneumonia. Continue symptom management. Strict ED precautions if symptoms become severe.  Final Clinical Impressions(s) / UC Diagnoses   Final diagnoses:  Viral URI with cough  Encounter for screening for COVID-19     Discharge Instructions      COVID test results with update to MyChart within 24 hours. Our office will follow-up with you only if your test is positive. Start Azithromycin for prophylaxis prevent pneumonia. Benzonatate [place] 200 mg  up three times per day for cough as needed. Emergency Department if any severe symptoms develop.      ED Prescriptions     Medication Sig Dispense Auth. Provider   azithromycin (ZITHROMAX) 250 MG tablet Take 2 tabs PO x 1 dose, then 1 tab PO QD x 4 days 6 tablet Bing Neighbors, NP   benzonatate (TESSALON) 200 MG capsule Take 1 capsule (200 mg total) by mouth 3 (three) times daily as needed for cough. 20 capsule Bing Neighbors, NP      PDMP not reviewed this encounter.   Bing Neighbors, NP 06/08/23 [phone]

## 2023-06-08 NOTE — Discharge Instructions (Signed)
COVID test results with update to MyChart within 24 hours. Our office will follow-up with you only if your test is positive. Start Azithromycin for prophylaxis prevent pneumonia. Benzonatate Pearls 200 mg  up three times per day for cough as needed. Emergency Department if any severe symptoms develop.

## 2023-06-08 NOTE — ED Triage Notes (Signed)
Patient presents to UC for cough, sweats, facial pain, and nasal congestion x 1 day. Concerned with sinus infection. Treating with lozenges and OTC cold med.

## 2023-06-09 ENCOUNTER — Telehealth: Payer: Self-pay

## 2023-06-09 ENCOUNTER — Telehealth: Payer: Self-pay | Admitting: Family Medicine

## 2023-06-09 DIAGNOSIS — U071 COVID-19: Secondary | ICD-10-CM

## 2023-06-09 MED ORDER — PAXLOVID (300/100) 20 X 150 MG & 10 X 100MG PO TBPK: 3.0000 | ORAL_TABLET | Freq: Two times a day (BID) | ORAL | 0 refills | Status: AC

## 2023-06-09 NOTE — Telephone Encounter (Signed)
Patient tested positive for COVID and called office to request Paxlovid. Prescribed medication. Messaged RN to call to advise hold Viagra while taking medication and hold for 24 hours after completing treatment with Paxlovid

## 2023-06-09 NOTE — Telephone Encounter (Signed)
Patient called regarding prescription for positive COVID results. Message sent to [facility], NP who prescribed him Paxlovid. Per provider instructions patient instructed stop use of Viagra, if he took dose today he has to wait and start Paxlovid tomorrow. He was also instructed to not restart Viagra medication for 24 hrs after completing the medication. Pt voiced understanding.

## 2023-06-11 NOTE — Progress Notes (Signed)
Patient tested positive for Covid 06/08/23. Message left for [facility] scheduler for Dr Maisie Fus that patient would need to be re-scheduled.

## 2023-06-14 NOTE — Progress Notes (Signed)
Called Dr Maisie Fus office spoke w/ triage nurse, Marcelino Duster, informed pt still on [facility] surgery schedule.  OR scheduler Morrie Sheldon had been notified previously by PAT nurse Luther Parody on 06-11-2023 due to pt positive COVID he would need to be rescheduled.  Since pt is immunocompromised per guidelines schedule after 21 days, which would be after 07/01/2023.

## 2023-06-22 ENCOUNTER — Encounter (HOSPITAL_BASED_OUTPATIENT_CLINIC_OR_DEPARTMENT_OTHER): Payer: Self-pay | Admitting: General Surgery

## 2023-06-22 NOTE — Progress Notes (Signed)
Spoke w/ via phone for pre-op interview--- pt Lab needs dos----    [employer], ekg           Lab results------ no COVID test -----patient states asymptomatic no test needed Arrive at ------- 0630 on 07-03-2023 NPO after MN NO Solid Food.  Clear liquids from MN until--- 0530 Med rec completed Medications to take morning of surgery ----- lipitor, biktarvy Diabetic medication ----- per diabetic guidelines pt would do half dose toujeo morning of surgery. Per if he does have a large meal the night before and his blood sugar morning of sugary is low he plans on not doing any insulin. Advised pt to call his endocrinologist for recommendation.  He knows not to take metformin morning of surgery.  Patient instructed no nail polish to be worn day of surgery Patient instructed to bring photo id and insurance card day of surgery Patient aware to have Driver (ride ) / caregiver    for 24 hours after surgery -- partner, rochelle Patient Special Instructions ----- n/a Pre-Op special Instructions ----- n/a Patient verbalized understanding of instructions that were given at this phone interview. Patient denies shortness of breath, chest pain, fever, cough at this phone interview.

## 2023-07-03 ENCOUNTER — Ambulatory Visit (HOSPITAL_BASED_OUTPATIENT_CLINIC_OR_DEPARTMENT_OTHER): Payer: BC Managed Care – PPO | Admitting: Certified Registered Nurse Anesthetist

## 2023-07-03 ENCOUNTER — Other Ambulatory Visit: Payer: Self-pay

## 2023-07-03 ENCOUNTER — Ambulatory Visit (HOSPITAL_BASED_OUTPATIENT_CLINIC_OR_DEPARTMENT_OTHER)
Admission: RE | Admit: 2023-07-03 | Discharge: 2023-07-03 | Disposition: A | Discharge status: Home / Self Care | Payer: BC Managed Care – PPO | Source: Home / Self Care | Attending: General Surgery | Admitting: General Surgery

## 2023-07-03 ENCOUNTER — Encounter (HOSPITAL_BASED_OUTPATIENT_CLINIC_OR_DEPARTMENT_OTHER)
Admission: RE | Disposition: A | Discharge status: Home / Self Care | Payer: Self-pay | Source: Home / Self Care | Attending: General Surgery

## 2023-07-03 ENCOUNTER — Encounter (HOSPITAL_BASED_OUTPATIENT_CLINIC_OR_DEPARTMENT_OTHER): Payer: Self-pay | Admitting: General Surgery

## 2023-07-03 DIAGNOSIS — E119 Type 2 diabetes mellitus without complications: Secondary | ICD-10-CM | POA: Diagnosis not present

## 2023-07-03 DIAGNOSIS — Z21 Asymptomatic human immunodeficiency virus [HIV] infection status: Secondary | ICD-10-CM | POA: Diagnosis not present

## 2023-07-03 DIAGNOSIS — K6282 Dysplasia of anus: Secondary | ICD-10-CM | POA: Diagnosis present

## 2023-07-03 DIAGNOSIS — Z01818 Encounter for other preprocedural examination: Secondary | ICD-10-CM

## 2023-07-03 HISTORY — DX: Anal high risk human papillomavirus (HPV) DNA test positive: R85.81

## 2023-07-03 HISTORY — PX: HIGH RESOLUTION ANOSCOPY: SHX6345

## 2023-07-03 HISTORY — DX: Long term (current) use of insulin: E11.65

## 2023-07-03 HISTORY — DX: Personal history of colonic polyps: Z86.010

## 2023-07-03 HISTORY — DX: Long term (current) use of insulin: Z79.4

## 2023-07-03 HISTORY — DX: Personal history of adenomatous and serrated colon polyps: Z86.0101

## 2023-07-03 HISTORY — DX: Presence of spectacles and contact lenses: Z97.3

## 2023-07-03 LAB — POCT I-STAT, CHEM 8
BUN: 11 mg/dL (ref 8–23)
Calcium, Ion: 1.3 mmol/L (ref 1.15–1.40)
Chloride: 103 mmol/L (ref 98–111)
Creatinine, Ser: 0.9 mg/dL (ref 0.61–1.24)
Glucose, Bld: 202 mg/dL — ABNORMAL HIGH (ref 70–99)
HCT: 41 % (ref 39.0–52.0)
Hemoglobin: 13.9 g/dL (ref 13.0–17.0)
Potassium: 3.9 mmol/L (ref 3.5–5.1)
Sodium: 142 mmol/L (ref 135–145)
TCO2: 25 mmol/L (ref 22–32)

## 2023-07-03 LAB — GLUCOSE, CAPILLARY: Glucose-Capillary: 189 mg/dL — ABNORMAL HIGH (ref 70–99)

## 2023-07-03 SURGERY — ANOSCOPY, HIGH RESOLUTION
Anesthesia: Monitor Anesthesia Care

## 2023-07-03 MED ORDER — LACTATED RINGERS IV SOLN: INTRAVENOUS | Status: DC

## 2023-07-03 MED ORDER — ACETIC ACID 5 % SOLN
Status: DC | PRN
  Administered 2023-07-03: 1 via TOPICAL

## 2023-07-03 MED ORDER — FENTANYL CITRATE (PF) 250 MCG/5ML IJ SOLN
INTRAMUSCULAR | Status: DC | PRN
  Administered 2023-07-03 (×4): 25 ug via INTRAVENOUS

## 2023-07-03 MED ORDER — PROPOFOL 500 MG/50ML IV EMUL
INTRAVENOUS | Status: DC | PRN
  Administered 2023-07-03: 150 ug/kg/min via INTRAVENOUS

## 2023-07-03 MED ORDER — ONDANSETRON HCL 4 MG/2ML IJ SOLN
INTRAMUSCULAR | Status: AC
  Filled 2023-07-03: qty 2

## 2023-07-03 MED ORDER — LIDOCAINE 2% (20 MG/ML) 5 ML SYRINGE
INTRAMUSCULAR | Status: DC | PRN
  Administered 2023-07-03: 40 mg via INTRAVENOUS

## 2023-07-03 MED ORDER — SODIUM CHLORIDE 0.9% FLUSH: 3.0000 mL | Freq: Two times a day (BID) | INTRAVENOUS | Status: DC

## 2023-07-03 MED ORDER — FENTANYL CITRATE (PF) 100 MCG/2ML IJ SOLN: 25.0000 ug | INTRAMUSCULAR | Status: DC | PRN

## 2023-07-03 MED ORDER — OXYCODONE HCL 5 MG/5ML PO SOLN: 5.0000 mg | Freq: Once | ORAL | Status: DC | PRN

## 2023-07-03 MED ORDER — 0.9 % SODIUM CHLORIDE (POUR BTL) OPTIME
TOPICAL | Status: DC | PRN
  Administered 2023-07-03: 500 mL

## 2023-07-03 MED ORDER — TRAMADOL HCL 50 MG PO TABS
50.0000 mg | ORAL_TABLET | Freq: Four times a day (QID) | ORAL | 0 refills | Status: DC | PRN
Start: 2023-07-03 — End: 2024-09-16

## 2023-07-03 MED ORDER — ACETAMINOPHEN 500 MG PO TABS
1000.0000 mg | ORAL_TABLET | ORAL | Status: AC
  Administered 2023-07-03: 1000 mg via ORAL

## 2023-07-03 MED ORDER — FENTANYL CITRATE (PF) 100 MCG/2ML IJ SOLN
INTRAMUSCULAR | Status: AC
  Filled 2023-07-03: qty 2

## 2023-07-03 MED ORDER — OXYCODONE HCL 5 MG PO TABS: 5.0000 mg | ORAL_TABLET | Freq: Once | ORAL | Status: DC | PRN

## 2023-07-03 MED ORDER — BUPIVACAINE-EPINEPHRINE 0.5% -1:200000 IJ SOLN
INTRAMUSCULAR | Status: DC | PRN
  Administered 2023-07-03: 28 mL

## 2023-07-03 MED ORDER — BUPIVACAINE LIPOSOME 1.3 % IJ SUSP
INTRAMUSCULAR | Status: DC | PRN
  Administered 2023-07-03: 20 mL

## 2023-07-03 MED ORDER — ACETAMINOPHEN 500 MG PO TABS
ORAL_TABLET | ORAL | Status: AC
  Filled 2023-07-03: qty 2

## 2023-07-03 MED ORDER — DEXMEDETOMIDINE HCL IN NACL 80 MCG/20ML IV SOLN
INTRAVENOUS | Status: DC | PRN
  Administered 2023-07-03: 8 ug via INTRAVENOUS

## 2023-07-03 MED ORDER — MIDAZOLAM HCL 2 MG/2ML IJ SOLN
INTRAMUSCULAR | Status: AC
  Filled 2023-07-03: qty 2

## 2023-07-03 MED ORDER — ONDANSETRON HCL 4 MG/2ML IJ SOLN
INTRAMUSCULAR | Status: DC | PRN
  Administered 2023-07-03: 4 mg via INTRAVENOUS

## 2023-07-03 MED ORDER — MIDAZOLAM HCL 2 MG/2ML IJ SOLN
INTRAMUSCULAR | Status: DC | PRN
  Administered 2023-07-03: 2 mg via INTRAVENOUS

## 2023-07-03 MED ORDER — PROPOFOL 10 MG/ML IV BOLUS
INTRAVENOUS | Status: DC | PRN
Start: 2023-07-03 — End: 2023-07-03
  Administered 2023-07-03 (×2): 20 mg via INTRAVENOUS

## 2023-07-03 MED ORDER — ONDANSETRON HCL 4 MG/2ML IJ SOLN
4.0000 mg | Freq: Four times a day (QID) | INTRAMUSCULAR | Status: AC | PRN
  Administered 2023-07-03: 4 mg via INTRAVENOUS

## 2023-07-03 MED ORDER — LIDOCAINE 5 % EX OINT: 1.0000 | TOPICAL_OINTMENT | CUTANEOUS | 0 refills | Status: AC | PRN

## 2023-07-03 SURGICAL SUPPLY — 43 items
APL SKNCLS STERI-STRIP NONHPOA (GAUZE/BANDAGES/DRESSINGS) ×1
APL SWBSTK 6 STRL LF DISP (MISCELLANEOUS)
APPLICATOR COTTON TIP 6 STRL (MISCELLANEOUS) IMPLANT
APPLICATOR COTTON TIP 6IN STRL (MISCELLANEOUS)
BENZOIN TINCTURE PRP APPL 2/3 (GAUZE/BANDAGES/DRESSINGS) ×1 IMPLANT
BLADE SURG 10 STRL SS (BLADE) ×1 IMPLANT
BRIEF MESH DISP LRG (UNDERPADS AND DIAPERS) ×1 IMPLANT
COVER BACK TABLE 60X90IN (DRAPES) ×1 IMPLANT
DRAPE LAPAROTOMY 100X72 PEDS (DRAPES) ×1 IMPLANT
DRAPE UTILITY XL STRL (DRAPES) ×1 IMPLANT
ELECT NDL TIP 2.8 STRL (NEEDLE) IMPLANT
ELECT NEEDLE TIP 2.8 STRL (NEEDLE) ×1
ELECT REM PT RETURN 9FT ADLT (ELECTROSURGICAL) ×1
ELECTRODE REM PT RTRN 9FT ADLT (ELECTROSURGICAL) ×1 IMPLANT
GAUZE 4X4 16PLY ~~LOC~~+RFID DBL (SPONGE) ×1 IMPLANT
GAUZE PAD ABD 8X10 STRL (GAUZE/BANDAGES/DRESSINGS) IMPLANT
GAUZE SPONGE 4X4 12PLY STRL (GAUZE/BANDAGES/DRESSINGS) IMPLANT
GLOVE BIO SURGEON STRL SZ 6.5 (GLOVE) ×1 IMPLANT
GLOVE BIOGEL PI IND STRL 7.0 (GLOVE) IMPLANT
GLOVE INDICATOR 6.5 STRL GRN (GLOVE) ×1 IMPLANT
GLOVE SURG SYN 6.5 ES PF (GLOVE) ×1
GLOVE SURG SYN 6.5 PF PI (GLOVE) IMPLANT
GOWN STRL REUS W/ TWL LRG LVL3 (GOWN DISPOSABLE) IMPLANT
GOWN STRL REUS W/TWL LRG LVL3 (GOWN DISPOSABLE) ×1
KIT SIGMOIDOSCOPE (SET/KITS/TRAYS/PACK) IMPLANT
KIT TURNOVER CYSTO (KITS) ×1 IMPLANT
NDL HYPO 22X1.5 SAFETY MO (MISCELLANEOUS) ×1 IMPLANT
NEEDLE HYPO 22X1.5 SAFETY MO (MISCELLANEOUS) ×1
NS IRRIG 500ML POUR BTL (IV SOLUTION) ×1 IMPLANT
PACK BASIN DAY SURGERY FS (CUSTOM PROCEDURE TRAY) ×1 IMPLANT
PAD ARMBOARD 7.5X6 YLW CONV (MISCELLANEOUS) IMPLANT
PENCIL SMOKE EVACUATOR (MISCELLANEOUS) ×1 IMPLANT
SLEEVE SCD COMPRESS KNEE MED (STOCKING) ×1 IMPLANT
SPIKE FLUID TRANSFER (MISCELLANEOUS) ×1 IMPLANT
SPONGE SURGIFOAM ABS GEL 12-7 (HEMOSTASIS) IMPLANT
SUT CHROMIC 2 0 SH (SUTURE) IMPLANT
SUT CHROMIC 3 0 SH 27 (SUTURE) IMPLANT
SYR BULB IRRIG 60ML STRL (SYRINGE) ×1 IMPLANT
SYR CONTROL 10ML LL (SYRINGE) ×1 IMPLANT
TOWEL OR 17X24 6PK STRL BLUE (TOWEL DISPOSABLE) ×1 IMPLANT
TRAY DSU PREP LF (CUSTOM PROCEDURE TRAY) ×1 IMPLANT
TUBE CONNECTING 12X1/4 (SUCTIONS) ×1 IMPLANT
YANKAUER SUCT BULB TIP NO VENT (SUCTIONS) IMPLANT

## 2023-07-03 NOTE — Interval H&P Note (Signed)
History and Physical Interval Note:  07/03/2023 7:00 AM  Phillip Edwards  has presented today for surgery, with the diagnosis of anal pap test.  The various methods of treatment have been discussed with the patient and family. After consideration of risks, benefits and other options for treatment, the patient has consented to  Procedure(s): HIGH RESOLUTION ANOSCOPY POSSIBLE BIOPSY POSSIBLE EXCISION (N/A) as a surgical intervention.  The patient's history has been reviewed, patient examined, no change in status, stable for surgery.  I have reviewed the patient's chart and labs.  Questions were answered to the patient's satisfaction.     Vanita Panda, MD  Colorectal and General Surgery [facility]

## 2023-07-03 NOTE — Anesthesia Preprocedure Evaluation (Signed)
Anesthesia Evaluation  Patient identified by MRN, date of birth, ID band Patient awake    Reviewed: Allergy & Precautions, H&P , NPO status , Patient's Chart, lab work & pertinent test results  Airway Mallampati: II   Neck ROM: full    Dental   Pulmonary neg pulmonary ROS   breath sounds clear to auscultation       Cardiovascular negative cardio ROS  Rhythm:regular Rate:Normal     Neuro/Psych    GI/Hepatic   Endo/Other  diabetes, Type 2    Renal/GU      Musculoskeletal   Abdominal   Peds  Hematology  (+) HIV  Anesthesia Other Findings   Reproductive/Obstetrics                             Anesthesia Physical Anesthesia Plan  ASA: 2  Anesthesia Plan: MAC   Post-op Pain Management:    Induction: Intravenous  PONV Risk Score and Plan: 1 and Propofol infusion and Treatment may vary due to age or medical condition  Airway Management Planned: Simple Face Mask  Additional Equipment:   Intra-op Plan:   Post-operative Plan:   Informed Consent: I have reviewed the patients History and Physical, chart, labs and discussed the procedure including the risks, benefits and alternatives for the proposed anesthesia with the patient or authorized representative who has indicated his/her understanding and acceptance.     Dental advisory given  Plan Discussed with: CRNA, Anesthesiologist and Surgeon  Anesthesia Plan Comments:        Anesthesia Quick Evaluation

## 2023-07-03 NOTE — Transfer of Care (Signed)
Immediate Anesthesia Transfer of Care Note  Patient: Phillip Edwards  Procedure(s) Performed: HIGH RESOLUTION ANOSCOPY POSSIBLE BIOPSY and ABLATION OF ABNORMAL LESIONS  Patient Location: PACU  Anesthesia Type:MAC  Level of Consciousness: sedated  Airway & Oxygen Therapy: Patient Spontanous Breathing and Patient connected to face mask oxygen  Post-op Assessment: Report given to RN and Post -op Vital signs reviewed and stable  Post vital signs: Reviewed and stable  Last Vitals:  Vitals Value Taken Time  BP 116/65 07/03/23 0930  Temp    Pulse 80 07/03/23 0931  Resp 14 07/03/23 0931  SpO2 100 % 07/03/23 0931  Vitals shown include unfiled device data.  Last Pain:  Vitals:   07/03/23 0705  TempSrc: Oral  PainSc: 0-No pain      Patients Stated Pain Goal: 5 (07/03/23 0705)  Complications: No notable events documented.

## 2023-07-03 NOTE — Op Note (Signed)
07/03/2023  9:21 AM  PATIENT:  Phillip Edwards  62 y.o. male  Patient Care Team: Dorothey Baseman, MD as PCP - General (Family Medicine)  PRE-OPERATIVE DIAGNOSIS:  Abnormal anal pap test  POST-OPERATIVE DIAGNOSIS:  Abnormal anal pap test  PROCEDURE:  HIGH RESOLUTION ANOSCOPY with BIOPSY and ABLATION OF ABNORMAL LESIONS   Surgeon(s): Romie Levee, MD  ASSISTANT: none   ANESTHESIA:   local and MAC  EBL: 2ml  Total I/O In: 500 [I.V.:500] Out: 2 [Blood:2]  SPECIMEN:  Source of Specimen:  anterior anal margin, R lateral anal canal  DISPOSITION OF SPECIMEN:  PATHOLOGY  COUNTS:  YES  PLAN OF CARE: Discharge to home after PACU  PATIENT DISPOSITION:  PACU - hemodynamically stable.  INDICATION: 62 y.o. M with positive anal pap test  OR FINDINGS: concentration of small lesions at anterior anal margin and L lateral anal canal. Punctate lesions extending into R lateral and anterior anal canal  DESCRIPTION: The patient was identified in the preoperative holding area and taken to the OR where they were laid prone on the operating room table in jack knife position. MAC anesthesia was smoothly induced.  The patient was then prepped and draped in the usual sterile fashion. A surgical timeout was performed indicating the correct patient, procedure, positioning and preoperative antibioitics. SCDs were noted to be in place and functioning prior to the operation.   After this was completed, a sponge was soaked in 5% acetic acid was placed over the perianal region. This was allowed to soak for 2 minutes. The sponge was removed and the perianal region was evaluated with a colposcope.  There were lesions noted at anterior midline.  The internal anal canal was evaluated via anoscopy with a Hill-Ferguson anoscope.  There were punctate lesions noted throughout the L lateral, anterior and R lateral anal canal.  After this was completed, hemostasis was achieved with electrocautery and all of the biopsy  sites were closed using a 3-0 chromic suture.  Next I used a needle tip Bovie to ablate the punctate lesions. A sterile dressing was applied over this. The patient was then awakened from anesthesia and sent to the postanesthesia care unit in stable condition. All counts were correct operating room staff.   Vanita Panda, MD  Colorectal and General Surgery [facility]

## 2023-07-03 NOTE — Discharge Instructions (Addendum)
Do NOT take Tylenol until after 1:15pm.      Post Operative Instructions Following Laser Surgery  Laser treatment of condyloma (warts) is used to vaporize or eliminate the wart.  The laser actually creates a burn effect on the skin to accomplish this.  The following instructions will help in you comfort postoperatively:  First 24 h Remove the dressing this evening after you return home from the hospital Sit in a tub or sitz bath of COOL water for 20 minutes every hour while awake.  After the bath, carefully blot the area dry and place a piece of 100% Cotton with Silvadene on it next to the anal opening.  You may sit on a covered ice pack between the baths as needed.   You should have crushed ice in small amounts until you are able to urinate.  After you urinate, you may drink fluids.  It is normal to not urinate for the first few hours after surgery.  If you become uncomfortable, call the office for instructions.  Take your pain medications as prescribed You may eat whatever you feel like.  Start with a light meal and gradually advance your diet.    Beginning the day after your surgery Sit in a tub of cool to warm water for 15 minutes at least 3 times a day and after bowel movements After the bowel movement, clean with wet cotton, Tucks or baby wipes. Apply Silvadene to a thin piece of cotton and place over the anal opening after your baths.  This will collect any drainage or bleeding.  Drainage is usually a pink to tan color and is normal for the following 2-3 weeks after surgery.  Change to cotton ever 2-3 hours while awake.  Diet Eat a regular diet.  Avoid foods that may constipate you or give you diarrhea.  Avoid foods with seeds, nuts, corn or popcorn. Beginning the day after surgery, drink 6-8 glasses of water a day in addition to your meals.  Limit you caffeine intake to 1-2 servings per day.  Medication Take a fiber supplement (Metamucil, Citrucel, FiberCon) twice a day. Take a  stool softener like Colace twice daily Take your pain medication as directed.  You may use Extra Strength Tylenol instead of your prescribed pain medication to relieve mild discomfort.  Avoid products containing aspirin.  Bowel Habits You should have a bowel movement at least every other day.  If you are constipated, you may take a Fleet enema or 2 Dulcolax tablets.  Call the office if no results occur. You may bear down a normal amount to have a bowel movement without hurting your tissues after the operation.  Activity Walking is encouraged.  You may drive when you are no longer on prescription pain medication.  You may go up and down stairs carefully. No heavy lifting or strenuous activity until after your first post operative appointment Do NOT sit on a rubber ring; instead use a soft pillow if needed.  Call the office if you have any questions or concerns.  Call IMMEDIATELY if you develop: Rectal bleeding Increasing rectal pain Fever greater than 100 F Difficulty urinating     Post Anesthesia Home Care Instructions  Activity: Get plenty of rest for the remainder of the day. A responsible adult should stay with you for 24 hours following the procedure.  For the next 24 hours, DO NOT: -Drive a car -Advertising copywriter -Drink alcoholic beverages -Take any medication unless instructed by your physician -Make any legal decisions  or sign important papers.  Meals: Start with liquid foods such as gelatin or soup. Progress to regular foods as tolerated. Avoid greasy, spicy, heavy foods. If nausea and/or vomiting occur, drink only clear liquids until the nausea and/or vomiting subsides. Call your physician if vomiting continues.  Special Instructions/Symptoms: Your throat may feel dry or sore from the anesthesia or the breathing tube placed in your throat during surgery. If this causes discomfort, gargle with warm salt water. The discomfort should disappear within 24 hours.  Information  for Discharge Teaching: EXPAREL (bupivacaine liposome injectable suspension)   Your surgeon gave you EXPAREL(bupivacaine) in your surgical incision to help control your pain after surgery.  EXPAREL is a local anesthetic that provides pain relief by numbing the tissue around the surgical site. EXPAREL is designed to release pain medication over time and can control pain for up to 72 hours. Depending on how you respond to EXPAREL, you may require less pain medication during your recovery.  Possible side effects: Temporary loss of sensation or ability to move in the area where bupivacaine was injected. Nausea, vomiting, constipation Rarely, numbness and tingling in your mouth or lips, lightheadedness, or anxiety may occur. Call your doctor right away if you think you may be experiencing any of these sensations, or if you have other questions regarding possible side effects.  Follow all other discharge instructions given to you by your surgeon or nurse. Eat a healthy diet and drink plenty of water or other fluids.  If you return to the hospital for any reason within 96 hours following the administration of EXPAREL, please inform your health care providers.

## 2023-07-03 NOTE — Anesthesia Postprocedure Evaluation (Signed)
Anesthesia Post Note  Patient: ANASTASIO BONSELL  Procedure(s) Performed: HIGH RESOLUTION ANOSCOPY POSSIBLE BIOPSY and ABLATION OF ABNORMAL LESIONS     Patient location during evaluation: PACU Anesthesia Type: MAC Level of consciousness: awake and alert Pain management: pain level controlled Vital Signs Assessment: post-procedure vital signs reviewed and stable Respiratory status: spontaneous breathing, nonlabored ventilation, respiratory function stable and patient connected to nasal cannula oxygen Cardiovascular status: stable and blood pressure returned to baseline Postop Assessment: no apparent nausea or vomiting Anesthetic complications: no   No notable events documented.  Last Vitals:  Vitals:   07/03/23 0930 07/03/23 1110  BP: 116/65 (!) 132/91  Pulse: 84 66  Resp: 15 16  Temp: 36.4 C 36.6 C  SpO2: 99% 97%    Last Pain:  Vitals:   07/03/23 1015  TempSrc:   PainSc: 0-No pain                 Leilani Cespedes S

## 2023-07-04 ENCOUNTER — Encounter (HOSPITAL_BASED_OUTPATIENT_CLINIC_OR_DEPARTMENT_OTHER): Payer: Self-pay | Admitting: General Surgery

## 2023-07-04 LAB — SURGICAL PATHOLOGY

## 2023-07-14 ENCOUNTER — Other Ambulatory Visit: Payer: Self-pay | Admitting: Infectious Diseases

## 2023-10-18 ENCOUNTER — Ambulatory Visit: Payer: BC Managed Care – PPO | Attending: Infectious Diseases | Admitting: Infectious Diseases

## 2023-10-18 ENCOUNTER — Other Ambulatory Visit
Admission: RE | Admit: 2023-10-18 | Discharge: 2023-10-18 | Disposition: A | Discharge status: Home / Self Care | Payer: BC Managed Care – PPO | Source: Ambulatory Visit | Attending: Infectious Diseases | Admitting: Infectious Diseases

## 2023-10-18 ENCOUNTER — Encounter: Payer: Self-pay | Admitting: Infectious Diseases

## 2023-10-18 VITALS — BP 154/89 | HR 93 | Temp 97.9°F | Ht 69.0 in | Wt 164.0 lb

## 2023-10-18 DIAGNOSIS — Z09 Encounter for follow-up examination after completed treatment for conditions other than malignant neoplasm: Secondary | ICD-10-CM | POA: Diagnosis present

## 2023-10-18 DIAGNOSIS — B20 Human immunodeficiency virus [HIV] disease: Secondary | ICD-10-CM | POA: Insufficient documentation

## 2023-10-18 DIAGNOSIS — Z79899 Other long term (current) drug therapy: Secondary | ICD-10-CM | POA: Insufficient documentation

## 2023-10-18 DIAGNOSIS — E119 Type 2 diabetes mellitus without complications: Secondary | ICD-10-CM | POA: Diagnosis not present

## 2023-10-18 DIAGNOSIS — E781 Pure hyperglyceridemia: Secondary | ICD-10-CM | POA: Insufficient documentation

## 2023-10-18 DIAGNOSIS — Z794 Long term (current) use of insulin: Secondary | ICD-10-CM | POA: Insufficient documentation

## 2023-10-18 DIAGNOSIS — Z7984 Long term (current) use of oral hypoglycemic drugs: Secondary | ICD-10-CM | POA: Insufficient documentation

## 2023-10-18 LAB — CBC WITH DIFFERENTIAL/PLATELET
Abs Immature Granulocytes: 0.03 10*3/uL (ref 0.00–0.07)
Basophils Absolute: 0 10*3/uL (ref 0.0–0.1)
Basophils Relative: 0 %
Eosinophils Absolute: 0.1 10*3/uL (ref 0.0–0.5)
Eosinophils Relative: 1 %
HCT: 39.8 % (ref 39.0–52.0)
Hemoglobin: 14 g/dL (ref 13.0–17.0)
Immature Granulocytes: 1 %
Lymphocytes Relative: 26 %
Lymphs Abs: 1.6 10*3/uL (ref 0.7–4.0)
MCH: 32 pg (ref 26.0–34.0)
MCHC: 35.2 g/dL (ref 30.0–36.0)
MCV: 90.9 fL (ref 80.0–100.0)
Monocytes Absolute: 0.6 10*3/uL (ref 0.1–1.0)
Monocytes Relative: 10 %
Neutro Abs: 4 10*3/uL (ref 1.7–7.7)
Neutrophils Relative %: 62 %
Platelets: 186 10*3/uL (ref 150–400)
RBC: 4.38 MIL/uL (ref 4.22–5.81)
RDW: 12.6 % (ref 11.5–15.5)
WBC: 6.4 10*3/uL (ref 4.0–10.5)
nRBC: 0 % (ref 0.0–0.2)

## 2023-10-18 LAB — CHLAMYDIA/NGC RT PCR (ARMC ONLY)
Chlamydia Tr: NOT DETECTED
Chlamydia Tr: NOT DETECTED
Chlamydia Tr: NOT DETECTED
N gonorrhoeae: NOT DETECTED
N gonorrhoeae: NOT DETECTED
N gonorrhoeae: NOT DETECTED

## 2023-10-18 MED ORDER — BIKTARVY 50-200-25 MG PO TABS: 1.0000 | ORAL_TABLET | Freq: Every day | ORAL | 6 refills | Status: DC

## 2023-10-18 MED ORDER — DOXYCYCLINE HYCLATE 100 MG PO TABS: 200.0000 mg | ORAL_TABLET | ORAL | 0 refills | Status: AC | PRN

## 2023-10-18 NOTE — Progress Notes (Signed)
NAME: Phillip Edwards  DOB: [birth year]  MRN: [id number]  Date/Time: 10/18/2023 8:40 AM  Subjective:   ?follow up visit for HIV Last seen June 2024  On Biktarvy- 100% adherent Last VL <20 and cd4 is 509 ( 33.9%)  Phillip Edwards is a 62 y.o. male with a history  DM,  diagnosed with   HIV on 05/04/22 Started on [place] while in the hospital Vl 712K on diagnosis Cd4 was 33  Acquired thru sex with Men He is bisexual Was married for  75yrs and wife passed on in a MVA in 10-30-17 Now has a male partner - she knows his status. She is on PreP  ? 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   Wears contact lenses    Pancreatitis  Past Surgical History:  Procedure Laterality Date   CHOLECYSTECTOMY, LAPAROSCOPIC     1990s   COLONOSCOPY WITH PROPOFOL N/A 05/25/2017   Procedure: COLONOSCOPY WITH PROPOFOL;  Surgeon: Christena Deem, MD;  Location: [facility] ENDOSCOPY;  Service: Endoscopy;  Laterality: N/A;   COLONOSCOPY WITH PROPOFOL N/A 04/25/2023   Procedure: COLONOSCOPY WITH PROPOFOL;  Surgeon: Toledo, Boykin Nearing, MD;  Location: [facility] ENDOSCOPY;  Service: Gastroenterology;  Laterality: N/A;   HIGH RESOLUTION ANOSCOPY N/A 07/03/2023   Procedure: HIGH RESOLUTION ANOSCOPY POSSIBLE BIOPSY and ABLATION OF ABNORMAL LESIONS;  Surgeon: Romie Levee, MD;   Location: [facility] [place];  Service: General;  Laterality: N/A;   Cholecystectomy- lap Social History   Socioeconomic History   Marital status: Widowed    Spouse name: Not on file   Number of children: Not on file   Years of education: Not on file   Highest education level: Not on file  Occupational History   Not on file  Tobacco Use   Smoking status: Never   Smokeless tobacco: Never  Vaping Use   Vaping status: Never Used  Substance and Sexual Activity   Alcohol use: Yes    Comment: occasional   Drug use: Never   Sexual activity: Yes    Partners: Male, Male    Birth control/protection: None, Other-see comments    Comment: vasectomy  Other Topics Concern   Not on file  Social History Narrative   Not on file   Social Drivers of Health   Financial Resource Strain: Medium Risk (08/30/2023)   Received from [facility]   Overall Financial Resource Strain (CARDIA)    Difficulty of Paying Living Expenses: Somewhat hard  Food Insecurity: No Food Insecurity (08/30/2023)   [facility]   Hunger Vital Sign    Worried About Running Out of Food in the Last Year: Never true    Ran Out of Food in the Last Year: Never  true  Transportation Needs: No Transportation Needs (08/30/2023)   Received from [facility] - Transportation    In the past 12 months, has lack of transportation kept you from medical appointments or from getting medications?: No    Lack of Transportation (Non-Medical): No  Physical Activity: Not on file  Stress: Not on file  Social Connections: Not on file  Intimate Partner Violence: Not At Risk (12/28/2020)   Humiliation, Afraid, Rape, and Kick questionnaire    Fear of Current or Ex-Partner: No    Emotionally Abused: No    Physically Abused: No    Sexually Abused: No   Works at [employer]  FH DM in mother and father Crohns mom and sister  No Known  Allergies ? Current Outpatient Medications  Medication Sig Dispense Refill   atorvastatin (LIPITOR) 40 MG tablet Take 40 mg by mouth daily.     BD VEO INSULIN SYRINGE U/F 31G X 15/64" 1 ML MISC USE 1 SYRINGE 2 (TWO) TIMES DAILY     BIKTARVY 50-200-25 MG TABS tablet TAKE 1 TABLET BY MOUTH EVERY DAY 30 tablet 4   bismuth subsalicylate (PEPTO BISMOL) 262 MG/15ML suspension Take 30 mLs by mouth every 6 (six) hours as needed for indigestion or diarrhea or loose stools.     cetirizine (ZYRTEC) 10 MG tablet Take 10 mg by mouth daily as needed for allergies.     gabapentin (NEURONTIN) 100 MG capsule gabapentin (NEURONTIN) 100 MG capsule Start 1 cap qHS prn. Can increase to 2 , 3 or 4 caps nightly if needed after 3-5 nights per dose.     glipiZIDE (GLUCOTROL) 5 MG tablet Take 2.5-5 mg by mouth daily as needed. As directed by encrinologist--- take 1/2 to 1 tab daily if know will have high blood sugar's, or having high carbo diet     insulin glargine, 2 Unit Dial, (TOUJEO MAX SOLOSTAR) 300 UNIT/ML Solostar Pen Inject 120 Units into the skin daily.     lidocaine (XYLOCAINE) 5 % ointment Apply 1 Application topically as needed. 35.44 g 0   metFORMIN (GLUCOPHAGE-XR) 750 MG 24 hr tablet Take 1,500 mg by mouth daily with breakfast.     sildenafil (VIAGRA) 100 MG tablet Take 100 mg by mouth as needed for erectile dysfunction.     traMADol (ULTRAM) 50 MG tablet Take 1-2 tablets (50-100 mg total) by mouth every 6 (six) hours as needed. 30 tablet 0   No current facility-administered medications for this visit.    REVIEW OF SYSTEMS:  Const: negative fever, negative chills, negative weight loss Eyes: negative diplopia or visual changes, negative eye pain ENT: negative coryza, negative sore throat Resp: negative cough, hemoptysis, dyspnea Cards: negative for chest pain, palpitations, lower extremity edema GU: negative for frequency, dysuria and hematuria Skin: negative for rash and pruritus Heme: negative for  easy bruising and gum/nose bleeding MS: negative for myalgias, arthralgias, back pain and muscle weakness Neurolo:negative for headaches, dizziness, vertigo, memory problems  Psych: negative for feelings of anxiety, depression   Objective:  VITALS:  BP (!) 154/89   Pulse 93   Temp 97.9 F (36.6 C) (Temporal)   Ht 5\' 9"  (1.753 m)   Wt 164 lb (74.4 kg)   BMI 24.22 kg/m  PHYSICAL EXAM:  General: Alert, cooperative, no distress, appears stated age.  Head: Normocephalic, without obvious abnormality, atraumatic. Eyes: Conjunctivae clear, anicteric sclerae. Pupils are equal Nose: Nares normal. No drainage or sinus tenderness. Throat: Lips, mucosa, and  tongue normal. No Thrush Neck: Supple, symmetrical, no adenopathy, thyroid: non tender no carotid bruit and no JVD. Back: No CVA tenderness. Lungs: Clear to auscultation bilaterally. No Wheezing or Rhonchi. No rales. Heart: Regular rate and rhythm, no murmur, rub or gallop. Abdomen: Soft, non-tender,not distended. Bowel sounds normal. No masses Extremities: Extremities normal, atraumatic, no cyanosis. No edema. No clubbing Skin: No rashes or lesions. Not Jaundiced Lymph: Cervical, supraclavicular normal. Neurologic: Grossly non-focal Pertinent Labs  Health maintenance Vaccination  Vaccine Date last given comment  Influenza 2024   Hepatitis B 07/13/22 ( 3rd dose)   Hepatitis A    Pneumococcal 20 05/18/22   RSV    TdaP 07/13/22   HPV    Shingrix ( zoster vaccine)     ______________________  Labs Lab Result  Date comment  HIV VL <20 6/24   CD4 509 6/24   Genotype     [id number]     HIV antibody     RPR NR 12/23   Quantiferon Gold NR 07/13/22   Hep C ab     Hepatitis B-ab,ag,c     Hepatitis A-IgM, IgG /T     Lipid 371/1427/28.7    GC/CHL     PAP     HB,PLT,Cr, LFT       Preventive  Procedure Result  Date comment  colonoscopy  March 2024        Dental exam 2024    Opthal       Impression/Recommendation ? ?AIDS  -   On biktarvy  100% adherent- Vl < 20 Cd 4 is 509 Will check labs today   Doxy PEP- to prevent Stds ( 200mg  to be taken within  2-24 hrs after unprotected sex)  DM on metformin, insulin, saw  endocrinologist   Hyperlipidemia and hypertriglyceridemia On statin   Anal pap HGSIL, underwent HRA  Both male and male partners know his staus Male partner tested negative Male partner is positive and on HAART  H/o pancreatitis   Reinforced adherence to meds, safe sex  Follow up 6 months-

## 2023-10-18 NOTE — Patient Instructions (Signed)
Today I prescribed Doxycycline which can be taken within 2hrs -24 hrs after unprotected sex to prevent STDS like Chlamydia, Gc, Syphilis Today will do labs

## 2023-10-19 LAB — T-HELPER CELLS CD4/CD8 %
% CD 4 Pos. Lymph.: 35.4 % (ref 30.8–58.5)
Absolute CD 4 Helper: 602 /uL (ref 359–1519)
Basophils Absolute: 0 10*3/uL (ref 0.0–0.2)
Basos: 0 %
CD3+CD4+ Cells/CD3+CD8+ Cells Bld: 0.69 — ABNORMAL LOW (ref 0.92–3.72)
CD3+CD8+ Cells # Bld: 876 /uL (ref 109–897)
CD3+CD8+ Cells NFr Bld: 51.5 % — ABNORMAL HIGH (ref 12.0–35.5)
EOS (ABSOLUTE): 0.1 10*3/uL (ref 0.0–0.4)
Eos: 1 %
Hematocrit: 41.1 % (ref 37.5–51.0)
Hemoglobin: 14 g/dL (ref 13.0–17.7)
Immature Grans (Abs): 0 10*3/uL (ref 0.0–0.1)
Immature Granulocytes: 1 %
Lymphocytes Absolute: 1.7 10*3/uL (ref 0.7–3.1)
Lymphs: 27 %
MCH: 32.6 pg (ref 26.6–33.0)
MCHC: 34.1 g/dL (ref 31.5–35.7)
MCV: 96 fL (ref 79–97)
Monocytes Absolute: 0.7 10*3/uL (ref 0.1–0.9)
Monocytes: 11 %
Neutrophils Absolute: 3.9 10*3/uL (ref 1.4–7.0)
Neutrophils: 60 %
Platelets: 181 10*3/uL (ref 150–450)
RBC: 4.3 x10E6/uL (ref 4.14–5.80)
RDW: 12.6 % (ref 11.6–15.4)
WBC: 6.4 10*3/uL (ref 3.4–10.8)

## 2023-10-19 LAB — RPR: RPR Ser Ql: NONREACTIVE

## 2023-10-19 LAB — HIV-1 RNA QUANT-NO REFLEX-BLD
HIV 1 RNA Quant: <20 {copies}/mL
LOG10 HIV-1 RNA: UNDETERMINED {Log}

## 2023-10-22 ENCOUNTER — Telehealth: Payer: Self-pay

## 2023-10-22 ENCOUNTER — Other Ambulatory Visit: Payer: Self-pay | Admitting: Infectious Diseases

## 2023-10-22 NOTE — Telephone Encounter (Signed)
-----   Message from Lynn Ito sent at 10/19/2023  2:51 PM EST ----- Please let him know that his labs look great ----- Message ----- From: Leory Plowman, Lab In [place] Sent: 10/18/2023  10:05 AM EST To: Lynn Ito, MD

## 2023-10-25 ENCOUNTER — Ambulatory Visit: Payer: PRIVATE HEALTH INSURANCE | Admitting: Infectious Diseases

## 2023-10-25 NOTE — Telephone Encounter (Signed)
Patient advised of lab results and verbalized understanding.  Phillip Edwards, CMA

## 2024-04-03 ENCOUNTER — Ambulatory Visit: Attending: Infectious Diseases | Admitting: Infectious Diseases

## 2024-04-03 ENCOUNTER — Encounter: Payer: Self-pay | Admitting: Infectious Diseases

## 2024-04-03 ENCOUNTER — Other Ambulatory Visit
Admission: RE | Admit: 2024-04-03 | Discharge: 2024-04-03 | Disposition: A | Discharge status: Home / Self Care | Attending: Infectious Diseases | Admitting: Infectious Diseases

## 2024-04-03 VITALS — BP 152/76 | HR 81 | Temp 97.7°F | Wt 158.0 lb

## 2024-04-03 DIAGNOSIS — Z7984 Long term (current) use of oral hypoglycemic drugs: Secondary | ICD-10-CM | POA: Insufficient documentation

## 2024-04-03 DIAGNOSIS — E119 Type 2 diabetes mellitus without complications: Secondary | ICD-10-CM | POA: Insufficient documentation

## 2024-04-03 DIAGNOSIS — E785 Hyperlipidemia, unspecified: Secondary | ICD-10-CM | POA: Diagnosis not present

## 2024-04-03 DIAGNOSIS — Z79899 Other long term (current) drug therapy: Secondary | ICD-10-CM | POA: Diagnosis not present

## 2024-04-03 DIAGNOSIS — Z8719 Personal history of other diseases of the digestive system: Secondary | ICD-10-CM | POA: Diagnosis not present

## 2024-04-03 DIAGNOSIS — R634 Abnormal weight loss: Secondary | ICD-10-CM | POA: Insufficient documentation

## 2024-04-03 DIAGNOSIS — E781 Pure hyperglyceridemia: Secondary | ICD-10-CM | POA: Insufficient documentation

## 2024-04-03 DIAGNOSIS — Z21 Asymptomatic human immunodeficiency virus [HIV] infection status: Secondary | ICD-10-CM | POA: Diagnosis present

## 2024-04-03 DIAGNOSIS — B20 Human immunodeficiency virus [HIV] disease: Secondary | ICD-10-CM | POA: Insufficient documentation

## 2024-04-03 LAB — COMPREHENSIVE METABOLIC PANEL WITH GFR
ALT: 17 U/L (ref 0–44)
AST: 14 U/L — ABNORMAL LOW (ref 15–41)
Albumin: 4.1 g/dL (ref 3.5–5.0)
Alkaline Phosphatase: 52 U/L (ref 38–126)
Anion gap: 7 (ref 5–15)
BUN: 13 mg/dL (ref 8–23)
CO2: 27 mmol/L (ref 22–32)
Calcium: 9.2 mg/dL (ref 8.9–10.3)
Chloride: 105 mmol/L (ref 98–111)
Creatinine, Ser: 0.93 mg/dL (ref 0.61–1.24)
GFR, Estimated: >60 mL/min (ref >60)
Glucose, Bld: 215 mg/dL — ABNORMAL HIGH (ref 70–99)
Potassium: 4.2 mmol/L (ref 3.5–5.1)
Sodium: 139 mmol/L (ref 135–145)
Total Bilirubin: 1.6 mg/dL — ABNORMAL HIGH (ref 0.0–1.2)
Total Protein: 7.4 g/dL (ref 6.5–8.1)

## 2024-04-03 LAB — CBC WITH DIFFERENTIAL/PLATELET
Abs Immature Granulocytes: 0.03 10*3/uL (ref 0.00–0.07)
Basophils Absolute: 0 10*3/uL (ref 0.0–0.1)
Basophils Relative: 0 %
Eosinophils Absolute: 0.1 10*3/uL (ref 0.0–0.5)
Eosinophils Relative: 1 %
HCT: 39.4 % (ref 39.0–52.0)
Hemoglobin: 14.1 g/dL (ref 13.0–17.0)
Immature Granulocytes: 0 %
Lymphocytes Relative: 25 %
Lymphs Abs: 1.8 10*3/uL (ref 0.7–4.0)
MCH: 32.5 pg (ref 26.0–34.0)
MCHC: 35.8 g/dL (ref 30.0–36.0)
MCV: 90.8 fL (ref 80.0–100.0)
Monocytes Absolute: 0.6 10*3/uL (ref 0.1–1.0)
Monocytes Relative: 8 %
Neutro Abs: 4.8 10*3/uL (ref 1.7–7.7)
Neutrophils Relative %: 66 %
Platelets: 180 10*3/uL (ref 150–400)
RBC: 4.34 MIL/uL (ref 4.22–5.81)
RDW: 12.3 % (ref 11.5–15.5)
WBC: 7.4 10*3/uL (ref 4.0–10.5)
nRBC: 0 % (ref 0.0–0.2)

## 2024-04-03 LAB — CHLAMYDIA/NGC RT PCR (ARMC ONLY)
Chlamydia Tr: NOT DETECTED
Chlamydia Tr: NOT DETECTED
N gonorrhoeae: NOT DETECTED
N gonorrhoeae: NOT DETECTED

## 2024-04-03 LAB — TSH: TSH: 2.611 u[IU]/mL (ref 0.350–4.500)

## 2024-04-03 MED ORDER — BIKTARVY 50-200-25 MG PO TABS: 1.0000 | ORAL_TABLET | Freq: Every day | ORAL | 6 refills | Status: DC

## 2024-04-03 NOTE — Progress Notes (Signed)
 NAME: Phillip Edwards  DOB: November 07, 1960  MRN: [id number]  Date/Time: 04/03/2024 8:42 AM  Subjective:   ?follow up visit for HIV Last seen Dec 2024  On Biktarvy - 100% adherent Last VL <20 and cd4 is 709 Doing well Saw his PCP in April 2025 In may 2025 he had some conjunctivitis left eye, cleared with topical antibiotic  ALANZO LAMB is a 63 y.o. male with a history  DM,  diagnosed with   HIV on 05/04/22 Started on Biktarvy  while in the hospital Vl 712K on diagnosis Cd4 was 7  Acquired thru sex with Men He is bisexual Was married for  46yrs and wife passed on in a MVA in 13-Apr-2017 Now has a male partner - she knows his status. She is on PreP  ? 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   Wears contact lenses    Pancreatitis  Past Surgical History:  Procedure Laterality Date   CHOLECYSTECTOMY, LAPAROSCOPIC     1990s   COLONOSCOPY WITH PROPOFOL  N/A 05/25/2017   Procedure: COLONOSCOPY WITH PROPOFOL ;  Surgeon: Deveron Fly, MD;  Location: [facility] ENDOSCOPY;  Service: Endoscopy;  Laterality: N/A;   COLONOSCOPY WITH PROPOFOL  N/A 04/25/2023   Procedure: COLONOSCOPY WITH PROPOFOL ;  Surgeon: Toledo, Alphonsus Jeans, MD;  Location: [facility] ENDOSCOPY;  Service: Gastroenterology;  Laterality: N/A;   HIGH RESOLUTION ANOSCOPY N/A 07/03/2023    Procedure: HIGH RESOLUTION ANOSCOPY POSSIBLE BIOPSY and ABLATION OF ABNORMAL LESIONS;  Surgeon: Joyce Nixon, MD;  Location: [facility];  Service: General;  Laterality: N/A;   Cholecystectomy- lap Social History   Socioeconomic History   Marital status: Widowed    Spouse name: Not on file   Number of children: Not on file   Years of education: Not on file   Highest education level: Not on file  Occupational History   Not on file  Tobacco Use   Smoking status: Never   Smokeless tobacco: Never  Vaping Use   Vaping status: Never Used  Substance and Sexual Activity   Alcohol use: Yes    Comment: occasional   Drug use: Never   Sexual activity: Yes    Partners: Male, Male    Birth control/protection: None, Other-see comments    Comment: vasectomy  Other Topics Concern   Not on file  Social History Narrative   Not on file   Social Drivers of Health   Financial Resource Strain: Medium Risk (08/30/2023)   Received from [facility]   Overall Financial Resource Strain (CARDIA)    Difficulty of Paying Living Expenses: Somewhat hard  Food Insecurity: No Food Insecurity (08/30/2023)   Received from [facility]   Hunger Vital Sign    Worried About Running Out of  Food in the Last Year: Never true    Ran Out of Food in the Last Year: Never true  Transportation Needs: No Transportation Needs (08/30/2023)   Received from [facility] - Transportation    In the past 12 months, has lack of transportation kept you from medical appointments or from getting medications?: No    Lack of Transportation (Non-Medical): No  Physical Activity: Not on file  Stress: Not on file  Social Connections: Not on file  Intimate Partner Violence: Not At Risk (12/28/2020)   Humiliation, Afraid, Rape, and Kick questionnaire    Fear of Current or Ex-Partner: No    Emotionally Abused: No    Physically Abused: No    Sexually  Abused: No   Works at [employer]  FH DM in mother and father Crohns mom and sister  No Known Allergies ? Current Outpatient Medications  Medication Sig Dispense Refill   atorvastatin (LIPITOR) 40 MG tablet Take 40 mg by mouth daily.     BD VEO INSULIN  SYRINGE U/F 31G X 15/64" 1 ML MISC USE 1 SYRINGE 2 (TWO) TIMES DAILY     bictegravir-emtricitabine -tenofovir  AF (BIKTARVY ) 50-200-25 MG TABS tablet Take 1 tablet by mouth daily. 30 tablet 6   bismuth subsalicylate (PEPTO BISMOL) 262 MG/15ML suspension Take 30 mLs by mouth every 6 (six) hours as needed for indigestion or diarrhea or loose stools.     cetirizine (ZYRTEC) 10 MG tablet Take 10 mg by mouth daily as needed for allergies.     doxycycline  (VIBRA -TABS) 100 MG tablet Take 2 tablets (200 mg total) by mouth as needed. 20 tablet 0   gabapentin (NEURONTIN) 100 MG capsule gabapentin (NEURONTIN) 100 MG capsule Start 1 cap qHS prn. Can increase to 2 , 3 or 4 caps nightly if needed after 3-5 nights per dose.     glipiZIDE (GLUCOTROL) 5 MG tablet Take 2.5-5 mg by mouth daily as needed. As directed by encrinologist--- take 1/2 to 1 tab daily if know will have high blood sugar's, or having high carbo diet     insulin  glargine, 2 Unit Dial, (TOUJEO  MAX SOLOSTAR) 300 UNIT/ML Solostar Pen Inject 120 Units into the skin daily.     lidocaine  (XYLOCAINE ) 5 % ointment Apply 1 Application topically as needed. 35.44 g 0   metFORMIN (GLUCOPHAGE-XR) 750 MG 24 hr tablet Take 1,500 mg by mouth daily with breakfast.     sildenafil (VIAGRA) 100 MG tablet Take 100 mg by mouth as needed for erectile dysfunction.     traMADol  (ULTRAM ) 50 MG tablet Take 1-2 tablets (50-100 mg total) by mouth every 6 (six) hours as needed. 30 tablet 0   No current facility-administered medications for this visit.    REVIEW OF SYSTEMS:  Const: negative fever, negative chills, negative weight loss Eyes: negative diplopia or visual changes, negative eye pain ENT: negative  coryza, negative sore throat Resp: negative cough, hemoptysis, dyspnea Cards: negative for chest pain, palpitations, lower extremity edema GU: negative for frequency, dysuria and hematuria Skin: negative for rash and pruritus Heme: negative for easy bruising and gum/nose bleeding MS: negative for myalgias, arthralgias, back pain and muscle weakness Neurolo:negative for headaches, dizziness, vertigo, memory problems  Psych: negative for feelings of anxiety, depression   Objective:  VITALS:  BP (!) 152/76   Pulse 81   Temp 97.7 F (36.5 C) (Temporal)   Wt 158 lb (71.7 kg)   SpO2 96%   BMI 23.33 kg/m  PHYSICAL  EXAM:  General: Alert, cooperative, no distress, appears stated age.  Head: Normocephalic, without obvious abnormality, atraumatic. Eyes: Conjunctivae clear, anicteric sclerae. Pupils are equal Nose: Nares normal. No drainage or sinus tenderness. Throat: Lips, mucosa, and tongue normal. No Thrush Neck: Supple, symmetrical, no adenopathy, thyroid: non tender no carotid bruit and no JVD. Back: No CVA tenderness. Lungs: Clear to auscultation bilaterally. No Wheezing or Rhonchi. No rales. Heart: Regular rate and rhythm, no murmur, rub or gallop. Abdomen: Soft, non-tender,not distended. Bowel sounds normal. No masses Extremities: Extremities normal, atraumatic, no cyanosis. No edema. No clubbing Skin: No rashes or lesions. Not Jaundiced Lymph: Cervical, supraclavicular normal. Neurologic: Grossly non-focal Pertinent Labs  Health maintenance Vaccination  Vaccine Date last given comment  Influenza 2024   Hepatitis B 07/13/22 ( 3rd dose)   Hepatitis A    Pneumococcal 20 05/18/22   RSV    TdaP 07/13/22   HPV    Shingrix ( zoster vaccine)     ______________________  Labs Lab Result  Date comment  HIV VL <20 6/24   CD4 509 6/24   Genotype     [id number]     HIV antibody     RPR NR 12/23   Quantiferon Gold NR 07/13/22   Hep C ab     Hepatitis B-ab,ag,c     Hepatitis  A-IgM, IgG /T     Lipid 371/1427/28.7    GC/CHL     PAP     HB,PLT,Cr, LFT       Preventive  Procedure Result  Date comment  colonoscopy  March 2024        Dental exam 2024    Opthal       Impression/Recommendation ? ?AIDS  -  On biktarvy   100% adherent- Vl < 20 Cd 4 is 709 Will check labs today including RPR, HIV RNA, Cd4 Pt will have anal pap/STD screen-   Doxy PEP- to prevent Stds ( 200mg  to be taken within  2-24 hrs after unprotected sex)  DM on metformin, insulin , saw  endocrinologist   Hyperlipidemia and hypertriglyceridemia On statin   Anal pap HGSIL, underwent HRA and biopsy was normal in Aug 2024  Bisexual- male partner on [employer] PreP  H/o pancreatitis   Reinforced adherence to meds, safe sex  Follow up 3 months for pap and STD screen

## 2024-04-03 NOTE — Patient Instructions (Signed)
 During today's visit, we discussed your concerns about weight loss and conjunctivitis. We also reviewed your ongoing management of diabetes, hyperlipidemia, and HIV. Routine lab tests and follow-up plans were outlined to address these issues.  YOUR PLAN:  -HIV INFECTION, ASYMPTOMATIC: Your HIV is well-controlled with an undetectable viral load and a T cell count of 602. Continue taking Biktarvy  as prescribed. We will perform routine lab tests including T cell count, viral load, syphilis RPR, Quantiferon Gold test for TB exposure, CBC, and a comprehensive metabolic panel. We will also collect a urine sample and mouth swab.  -WEIGHT LOSS, UNEXPLAINED: You have experienced an unexplained weight loss of about 8 pounds over the last few months. This could be related to diabetes control, medication side effects, or thyroid issues. We will monitor your weight closely and check your TSH levels to rule out thyroid issues. Routine lab tests will also be performed to check for anemia and other potential causes.  -TYPE 2 DIABETES MELLITUS WITH HYPERGLYCEMIA: Your diabetes is under better control with a hemoglobin A1c of 7.4. Continue taking your current diabetes medications: Toujeo , Glucophage, and glipizide as needed. We will monitor your weight closely and check your TSH levels to rule out thyroid issues as a cause of weight loss. Routine lab tests will be performed to check for anemia and other potential causes.  -HYPERTRIGLYCERIDEMIA: Your triglycerides remain elevated at 486 despite being on atorvastatin 40 mg daily. Continue taking atorvastatin 40 mg daily, and we will monitor your triglyceride levels.  -ABNORMAL PAP SMEAR: You had an abnormal Pap smear in the past and underwent surgery. A repeat Pap smear and STD screening will be rescheduled for a later date when you are more comfortable.  INSTRUCTIONS:  Please follow up with the lab tests as discussed. We will reschedule your Pap smear and STD screening  for a later date . Continue taking your medications as prescribed and monitor your weight closely. If you experience any new symptoms or have concerns, please contact our office.

## 2024-04-04 LAB — T-HELPER CELLS CD4/CD8 %
% CD 4 Pos. Lymph.: 35.4 % (ref 30.8–58.5)
Absolute CD 4 Helper: 637 /uL (ref 359–1519)
Basophils Absolute: 0 10*3/uL (ref 0.0–0.2)
Basos: 0 %
CD3+CD4+ Cells/CD3+CD8+ Cells Bld: 0.65 — ABNORMAL LOW (ref 0.92–3.72)
CD3+CD8+ Cells # Bld: 977 /uL — ABNORMAL HIGH (ref 109–897)
CD3+CD8+ Cells NFr Bld: 54.3 % — ABNORMAL HIGH (ref 12.0–35.5)
EOS (ABSOLUTE): 0.1 10*3/uL (ref 0.0–0.4)
Eos: 1 %
Hematocrit: 42.9 % (ref 37.5–51.0)
Hemoglobin: 14.5 g/dL (ref 13.0–17.7)
Immature Grans (Abs): 0.1 10*3/uL (ref 0.0–0.1)
Immature Granulocytes: 1 %
Lymphocytes Absolute: 1.8 10*3/uL (ref 0.7–3.1)
Lymphs: 25 %
MCH: 32.7 pg (ref 26.6–33.0)
MCHC: 33.8 g/dL (ref 31.5–35.7)
MCV: 97 fL (ref 79–97)
Monocytes Absolute: 0.6 10*3/uL (ref 0.1–0.9)
Monocytes: 8 %
Neutrophils Absolute: 4.6 10*3/uL (ref 1.4–7.0)
Neutrophils: 65 %
Platelets: 203 10*3/uL (ref 150–450)
RBC: 4.44 x10E6/uL (ref 4.14–5.80)
RDW: 12.3 % (ref 11.6–15.4)
WBC: 7.1 10*3/uL (ref 3.4–10.8)

## 2024-04-04 LAB — HIV-1 RNA QUANT-NO REFLEX-BLD
HIV 1 RNA Quant: <20 {copies}/mL
LOG10 HIV-1 RNA: UNDETERMINED {Log_copies}/mL

## 2024-04-04 LAB — RPR: RPR Ser Ql: NONREACTIVE

## 2024-04-06 LAB — QUANTIFERON-TB GOLD PLUS (RQFGPL)
QuantiFERON Mitogen Value: >10 [IU]/mL
QuantiFERON Nil Value: 0.1 [IU]/mL
QuantiFERON TB1 Ag Value: 0.1 [IU]/mL
QuantiFERON TB2 Ag Value: 0.11 [IU]/mL

## 2024-04-06 LAB — QUANTIFERON-TB GOLD PLUS: QuantiFERON-TB Gold Plus: NEGATIVE

## 2024-04-09 ENCOUNTER — Ambulatory Visit: Payer: Self-pay

## 2024-04-09 NOTE — Telephone Encounter (Signed)
-----   Message from Alica Inks sent at 04/08/2024 12:37 PM EDT ----- Please let him know that his labs all came back fine- Vl < 20 Cd4 <> 600, thx ----- Message ----- From: Interface, Lab In [place] Sent: 04/03/2024  10:07 AM EDT To: Alica Inks, MD

## 2024-04-09 NOTE — Telephone Encounter (Signed)
 Patient informed of lab results and verbalized understanding. Phillip Edwards Phillip Edwards, CMA

## 2024-04-17 ENCOUNTER — Ambulatory Visit: Payer: BC Managed Care – PPO | Admitting: Infectious Diseases

## 2024-06-19 ENCOUNTER — Ambulatory Visit: Admitting: Infectious Diseases

## 2024-09-16 ENCOUNTER — Other Ambulatory Visit: Payer: Self-pay

## 2024-09-16 ENCOUNTER — Ambulatory Visit: Attending: Infectious Diseases | Admitting: Infectious Diseases

## 2024-09-16 ENCOUNTER — Encounter: Payer: Self-pay | Admitting: Infectious Diseases

## 2024-09-16 VITALS — BP 145/85 | HR 86 | Temp 98.1°F | Ht 69.0 in | Wt 159.0 lb

## 2024-09-16 DIAGNOSIS — E119 Type 2 diabetes mellitus without complications: Secondary | ICD-10-CM | POA: Diagnosis present

## 2024-09-16 DIAGNOSIS — Z79899 Other long term (current) drug therapy: Secondary | ICD-10-CM | POA: Insufficient documentation

## 2024-09-16 DIAGNOSIS — Z23 Encounter for immunization: Secondary | ICD-10-CM | POA: Diagnosis not present

## 2024-09-16 DIAGNOSIS — Z8719 Personal history of other diseases of the digestive system: Secondary | ICD-10-CM | POA: Diagnosis not present

## 2024-09-16 DIAGNOSIS — Z7253 High risk bisexual behavior: Secondary | ICD-10-CM | POA: Diagnosis not present

## 2024-09-16 DIAGNOSIS — B20 Human immunodeficiency virus [HIV] disease: Secondary | ICD-10-CM

## 2024-09-16 DIAGNOSIS — Z794 Long term (current) use of insulin: Secondary | ICD-10-CM | POA: Diagnosis not present

## 2024-09-16 DIAGNOSIS — Z7984 Long term (current) use of oral hypoglycemic drugs: Secondary | ICD-10-CM | POA: Diagnosis not present

## 2024-09-16 DIAGNOSIS — E781 Pure hyperglyceridemia: Secondary | ICD-10-CM | POA: Insufficient documentation

## 2024-09-16 DIAGNOSIS — Z21 Asymptomatic human immunodeficiency virus [HIV] infection status: Secondary | ICD-10-CM | POA: Diagnosis present

## 2024-09-16 MED ORDER — BIKTARVY 50-200-25 MG PO TABS: 1.0000 | ORAL_TABLET | Freq: Every day | ORAL | 6 refills | Status: AC

## 2024-09-16 MED ORDER — SHINGRIX 50 MCG/0.5ML IM SUSR
0.5000 mL | Freq: Once | INTRAMUSCULAR | 0 refills | Status: AC
  Filled 2024-09-16: qty 0.5, 1d supply, fill #0

## 2024-09-16 NOTE — Progress Notes (Signed)
 NAME: Phillip Edwards  DOB: 1961/10/03  MRN: [id number]  Date/Time: 09/16/2024 8:58 AM  Subjective:  Phillip Edwards is a 63 y.o. male with a history  DM,  diagnosed with   HIV on 05/04/22 Started on Biktarvy  while in the hospital Vl 712K on diagnosis Cd4 was 162 ?follow up visit for HIV Last seen March 2025  On Biktarvy - 100% adherent Last VL <20 and cd4 is 637 Doing well Saw his PCP in April 2025 Nena sanders is his endocrinologist    Acquired thru sex with Men He is bisexual Was married for  36yrs and wife passed on in a MVA in 10-09-2017 Now has a male partner - she knows his status. She is on PreP  ? 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   Wears contact lenses    Pancreatitis  Past Surgical History:  Procedure Laterality Date   CHOLECYSTECTOMY, LAPAROSCOPIC     1990s   COLONOSCOPY WITH PROPOFOL  N/A 05/25/2017   Procedure: COLONOSCOPY WITH PROPOFOL ;  Surgeon: Gaylyn Gladis PENNER, MD;  Location: [facility] ENDOSCOPY;  Service: Endoscopy;  Laterality: N/A;   COLONOSCOPY WITH PROPOFOL  N/A 04/25/2023   Procedure: COLONOSCOPY WITH PROPOFOL ;  Surgeon: Toledo, Ladell POUR, MD;  Location: [facility] ENDOSCOPY;  Service: Gastroenterology;  Laterality: N/A;   HIGH RESOLUTION ANOSCOPY N/A 07/03/2023   Procedure: HIGH RESOLUTION ANOSCOPY POSSIBLE  BIOPSY and ABLATION OF ABNORMAL LESIONS;  Surgeon: Debby Hila, MD;  Location: [facility];  Service: General;  Laterality: N/A;   Cholecystectomy- lap Social History   Socioeconomic History   Marital status: Widowed    Spouse name: Not on file   Number of children: Not on file   Years of education: Not on file   Highest education level: Not on file  Occupational History   Not on file  Tobacco Use   Smoking status: Never   Smokeless tobacco: Never  Vaping Use   Vaping status: Never Used  Substance and Sexual Activity   Alcohol use: Yes    Comment: occasional   Drug use: Never   Sexual activity: Yes    Partners: Male, Male    Birth control/protection: None, Other-see comments    Comment: vasectomy  Other Topics Concern   Not on file  Social History Narrative   Not on file   Social Drivers of Health   Financial Resource Strain: Medium Risk (08/30/2023)   [facility]   Overall Financial Resource Strain (CARDIA)    Difficulty of Paying Living Expenses: Somewhat hard  Food Insecurity: No Food Insecurity (08/30/2023)   [facility] from [facility]   Hunger Vital Sign    Within the past 12 months, you worried that your food would run out  before you got the money to buy more.: Never true    Within the past 12 months, the food you bought just didn't last and you didn't have money to get more.: Never true  Transportation Needs: No Transportation Needs (08/30/2023)   Received from [facility] - Transportation    In the past 12 months, has lack of transportation kept you from medical appointments or from getting medications?: No    Lack of Transportation (Non-Medical): No  Physical Activity: Not on file  Stress: Not on file  Social Connections: Not on file  Intimate Partner Violence: Not At Risk (12/28/2020)   Humiliation, Afraid, Rape, and Kick questionnaire    Fear of Current or  Ex-Partner: No    Emotionally Abused: No    Physically Abused: No    Sexually Abused: No   Works at [employer]  FH DM in mother and father Crohns mom and sister  No Known Allergies ? Current Outpatient Medications  Medication Sig Dispense Refill   atorvastatin (LIPITOR) 40 MG tablet Take 40 mg by mouth daily.     BD VEO INSULIN  SYRINGE U/F 31G X 15/64 1 ML MISC USE 1 SYRINGE 2 (TWO) TIMES DAILY     bictegravir-emtricitabine -tenofovir  AF (BIKTARVY ) 50-200-25 MG TABS tablet Take 1 tablet by mouth daily. 30 tablet 6   bismuth subsalicylate (PEPTO BISMOL) 262 MG/15ML suspension Take 30 mLs by mouth every 6 (six) hours as needed for indigestion or diarrhea or loose stools.     cetirizine (ZYRTEC) 10 MG tablet Take 10 mg by mouth daily as needed for allergies.     doxycycline  (VIBRA -TABS) 100 MG tablet Take 2 tablets (200 mg total) by mouth as needed. 20 tablet 0   gabapentin (NEURONTIN) 100 MG capsule gabapentin (NEURONTIN) 100 MG capsule Start 1 cap qHS prn. Can increase to 2 , 3 or 4 caps nightly if needed after 3-5 nights per dose.     glipiZIDE (GLUCOTROL) 5 MG tablet Take 2.5-5 mg by mouth daily as needed. As directed by encrinologist--- take 1/2 to 1 tab daily if know will have high blood sugar's, or having high carbo diet     insulin  glargine, 2 Unit Dial, (TOUJEO  MAX SOLOSTAR) 300 UNIT/ML Solostar Pen Inject 120 Units into the skin daily.     lidocaine  (XYLOCAINE ) 5 % ointment Apply 1 Application topically as needed. 35.44 g 0   metFORMIN (GLUCOPHAGE-XR) 750 MG 24 hr tablet Take 1,500 mg by mouth daily with breakfast.     sildenafil (VIAGRA) 100 MG tablet Take 100 mg by mouth as needed for erectile dysfunction.     No current facility-administered medications for this visit.    REVIEW OF SYSTEMS:  Const: negative fever, negative chills, negative weight loss Eyes: negative diplopia or visual changes, negative eye pain ENT: negative coryza, negative sore throat Resp: negative  cough, hemoptysis, dyspnea Cards: negative for chest pain, palpitations, lower extremity edema GU: negative for frequency, dysuria and hematuria Skin: negative for rash and pruritus Heme: negative for easy bruising and gum/nose bleeding MS: negative for myalgias, arthralgias, back pain and muscle weakness Neurolo:negative for headaches, dizziness, vertigo, memory problems  Psych: negative for feelings of anxiety, depression   Objective:  VITALS:  BP (!) 145/85   Pulse 86   Temp 98.1 F (36.7 C) (Temporal)   Ht 5' 9 (1.753 m)   Wt 159 lb (72.1 kg)   SpO2 97%   BMI 23.48 kg/m  PHYSICAL EXAM:  General: Alert, cooperative, no distress, appears stated age.  Head: Normocephalic, without obvious abnormality, atraumatic. Eyes: Conjunctivae clear, anicteric sclerae. Pupils are equal Nose: Nares normal. No drainage or sinus tenderness. Throat: Lips, mucosa, and tongue normal. No Thrush Neck: Supple, symmetrical, no adenopathy, thyroid : non tender no carotid bruit and no JVD. Back: No CVA tenderness. Lungs: Clear to auscultation bilaterally. No Wheezing or Rhonchi. No rales. Heart: Regular rate and rhythm, no murmur, rub or gallop. Abdomen: Soft, non-tender,not distended. Bowel sounds normal. No masses Extremities: Extremities normal, atraumatic, no cyanosis. No edema. No clubbing Skin: No rashes or lesions. Not Jaundiced Lymph: Cervical, supraclavicular normal. Neurologic: Grossly non-focal Pertinent Labs  Health maintenance Vaccination  Vaccine Date last given comment  Influenza 2024   Hepatitis B 07/13/22 ( 3rd dose)   Hepatitis A    Pneumococcal 20 05/18/22   RSV    TdaP 07/13/22   HPV    Shingrix ( zoster vaccine)     ______________________  Labs Lab Result  Date comment  HIV VL <20 3/25   CD4 638 3/25   Genotype     [id number]     HIV antibody     RPR NR 3/25   Quantiferon Gold NR 07/13/22   Hep C ab     Hepatitis B-ab,ag,c     Hepatitis A-IgM, IgG /T     Lipid  371/1427/28.7    GC/CHL     PAP     HB,PLT,Cr, LFT       Preventive  Procedure Result  Date comment  colonoscopy  March 2024        Dental exam 2024    Opthal       Impression/Recommendation ? ?AIDS  -  On biktarvy   100% adherent- Vl < 20 Cd 4 is 637   Doxy PEP- to prevent Stds ( 200mg  to be taken within  2-24 hrs after unprotected sex)- not used any  DM on metformin, insulin , followed by Gardiner Sanders   Hyperlipidemia and hypertriglyceridemia On statin   Anal pap HGSIL, underwent HRA and biopsy was normal in Aug 2024  Bisexual- male partner on [employer] PreP  H/o pancreatitis   Reinforced adherence to meds, safe sex   Flu vaccine given today  Follow up 6 months

## 2025-03-17 ENCOUNTER — Ambulatory Visit: Admitting: Infectious Diseases
